# Patient Record
Sex: Male | Born: 1990 | Race: White | Hispanic: No | Marital: Single | Smoking: Former smoker
Health system: Southern US, Community
[De-identification: ages and names within clinical notes are randomized; demographics above are authoritative.]

---
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
## 2013-01-18 ENCOUNTER — Emergency Department: Payer: Self-pay | Admitting: Emergency Medicine

## 2013-01-18 LAB — URINALYSIS, COMPLETE
Bacteria: NONE SEEN
Bilirubin,UR: NEGATIVE
Blood: NEGATIVE
Glucose,UR: NEGATIVE mg/dL (ref 0–75)
Nitrite: NEGATIVE
Ph: 8 (ref 4.5–8.0)
Protein: 30
RBC,UR: 3 /HPF (ref 0–5)
Squamous Epithelial: NONE SEEN
WBC UR: <1 /HPF (ref 0–5)

## 2013-01-18 LAB — CBC
HGB: 15.7 g/dL (ref 13.0–18.0)
MCH: 30.3 pg (ref 26.0–34.0)
MCV: 88 fL (ref 80–100)
RBC: 5.17 10*6/uL (ref 4.40–5.90)
WBC: 8.2 10*3/uL (ref 3.8–10.6)

## 2013-01-18 LAB — COMPREHENSIVE METABOLIC PANEL
Alkaline Phosphatase: 83 U/L (ref 50–136)
Anion Gap: 6 — ABNORMAL LOW (ref 7–16)
BUN: 10 mg/dL (ref 7–18)
Bilirubin,Total: 0.7 mg/dL (ref 0.2–1.0)
Calcium, Total: 9.3 mg/dL (ref 8.5–10.1)
Chloride: 107 mmol/L (ref 98–107)
Creatinine: 0.92 mg/dL (ref 0.60–1.30)
EGFR (African American): >60
EGFR (Non-African Amer.): >60
Osmolality: 277 (ref 275–301)
Potassium: 3.3 mmol/L — ABNORMAL LOW (ref 3.5–5.1)
Sodium: 139 mmol/L (ref 136–145)

## 2013-01-18 LAB — LIPASE, BLOOD: Lipase: 166 U/L (ref 73–393)

## 2014-12-18 ENCOUNTER — Emergency Department
Admission: EM | Admit: 2014-12-18 | Discharge: 2014-12-18 | Disposition: A | Discharge status: Home / Self Care | Payer: Self-pay | Attending: Emergency Medicine | Admitting: Emergency Medicine

## 2014-12-18 ENCOUNTER — Encounter: Payer: Self-pay | Admitting: Emergency Medicine

## 2014-12-18 DIAGNOSIS — X58XXXA Exposure to other specified factors, initial encounter: Secondary | ICD-10-CM | POA: Insufficient documentation

## 2014-12-18 DIAGNOSIS — Z720 Tobacco use: Secondary | ICD-10-CM | POA: Insufficient documentation

## 2014-12-18 DIAGNOSIS — Y998 Other external cause status: Secondary | ICD-10-CM | POA: Insufficient documentation

## 2014-12-18 DIAGNOSIS — Y9389 Activity, other specified: Secondary | ICD-10-CM | POA: Insufficient documentation

## 2014-12-18 DIAGNOSIS — Y9289 Other specified places as the place of occurrence of the external cause: Secondary | ICD-10-CM | POA: Insufficient documentation

## 2014-12-18 DIAGNOSIS — S239XXA Sprain of unspecified parts of thorax, initial encounter: Secondary | ICD-10-CM | POA: Insufficient documentation

## 2014-12-18 MED ORDER — CYCLOBENZAPRINE HCL 10 MG PO TABS: 10.0000 mg | ORAL_TABLET | Freq: Three times a day (TID) | ORAL | Status: DC | PRN

## 2014-12-18 MED ORDER — TRAMADOL HCL 50 MG PO TABS
50.0000 mg | ORAL_TABLET | Freq: Once | ORAL | Status: AC
  Administered 2014-12-18: 50 mg via ORAL
  Filled 2014-12-18: qty 1

## 2014-12-18 MED ORDER — NAPROXEN 500 MG PO TABS
500.0000 mg | ORAL_TABLET | Freq: Once | ORAL | Status: AC
  Administered 2014-12-18: 500 mg via ORAL
  Filled 2014-12-18: qty 1

## 2014-12-18 MED ORDER — CYCLOBENZAPRINE HCL 10 MG PO TABS
10.0000 mg | ORAL_TABLET | Freq: Once | ORAL | Status: AC
  Administered 2014-12-18: 10 mg via ORAL
  Filled 2014-12-18: qty 1

## 2014-12-18 MED ORDER — NAPROXEN 500 MG PO TABS: 500.0000 mg | ORAL_TABLET | Freq: Two times a day (BID) | ORAL | Status: DC

## 2014-12-18 NOTE — Discharge Instructions (Signed)
Back Pain, Adult °Back pain is very common. The pain often gets better over time. The cause of back pain is usually not dangerous. Most people can learn to manage their back pain on their own.  °HOME CARE  °· Stay active. Start with short walks on flat ground if you can. Try to walk farther each day. °· Do not sit, drive, or stand in one place for more than 30 minutes. Do not stay in bed. °· Do not avoid exercise or work. Activity can help your back heal faster. °· Be careful when you bend or lift an object. Bend at your knees, keep the object close to you, and do not twist. °· Sleep on a firm mattress. Lie on your side, and bend your knees. If you lie on your back, put a pillow under your knees. °· Only take medicines as told by your doctor. °· Put ice on the injured area. °¨ Put ice in a plastic bag. °¨ Place a towel between your skin and the bag. °¨ Leave the ice on for 15-20 minutes, 03-04 times a day for the first 2 to 3 days. After that, you can switch between ice and heat packs. °· Ask your doctor about back exercises or massage. °· Avoid feeling anxious or stressed. Find good ways to deal with stress, such as exercise. °GET HELP RIGHT AWAY IF:  °· Your pain does not go away with rest or medicine. °· Your pain does not go away in 1 week. °· You have new problems. °· You do not feel well. °· The pain spreads into your legs. °· You cannot control when you poop (bowel movement) or pee (urinate). °· Your arms or legs feel weak or lose feeling (numbness). °· You feel sick to your stomach (nauseous) or throw up (vomit). °· You have belly (abdominal) pain. °· You feel like you may pass out (faint). °MAKE SURE YOU:  °· Understand these instructions. °· Will watch your condition. °· Will get help right away if you are not doing well or get worse. °Document Released: 09/29/2007 Document Revised: 07/05/2011 Document Reviewed: 08/14/2013 °ExitCare® Patient Information ©2015 [employer]. This information is not intended  to replace advice given to you by your health care provider. Make sure you discuss any questions you have with your health care provider. ° °

## 2014-12-18 NOTE — ED Notes (Signed)
Lower back pain after picking up something heavy

## 2014-12-18 NOTE — ED Provider Notes (Signed)
[facility] Emergency Department Provider Note  ____________________________________________  Time seen: Approximately 11:24 AM  I have reviewed the triage vital signs and the nursing notes.   HISTORY  Chief Complaint Back Pain    HPI Robert Dunlap is a 24 y.o. male patient complaining of mid and low back pain secondary to heavy. That yesterday. Patient states of some furniture about 10 PM last night and developed acute back pain. Patient denies any radicular component to this pain and patient is rating his pain as a 10 over 10 describe it as sharp. No palliative measures taken for this complaint. denies any bowel or bladder dysfunction.   History reviewed. No pertinent past medical history.  There are no active problems to display for this patient.   History reviewed. No pertinent past surgical history.  No current outpatient prescriptions on file.  Allergies Review of patient's allergies indicates no known allergies.  No family history on file.  Social History Social History  Substance Use Topics  . Smoking status: Current Every Day Smoker  . Smokeless tobacco: None  . Alcohol Use: No    Review of Systems Constitutional: No fever/chills Eyes: No visual changes. ENT: No sore throat. Cardiovascular: Denies chest pain. Respiratory: Denies shortness of breath. Gastrointestinal: No abdominal pain.  No nausea, no vomiting.  No diarrhea.  No constipation. Genitourinary: Negative for dysuria. Musculoskeletal: Positive for back pain Skin: Negative for rash. Neurological: Negative for headaches, focal weakness or numbness.  10-point ROS otherwise negative.  ____________________________________________   PHYSICAL EXAM:  VITAL SIGNS: ED Triage Vitals  Enc Vitals Group     BP 12/18/14 1107 107/68 mmHg     Pulse Rate 12/18/14 1107 76     Resp 12/18/14 1107 20     Temp 12/18/14 1107 98.2 F (36.8 C)     Temp Source 12/18/14 1107 Oral    SpO2 12/18/14 1107 99 %     Weight 12/18/14 1107 220 lb (99.791 kg)     Height 12/18/14 1107 6\' 1"  (1.854 m)     Head Cir --      Peak Flow --      Pain Score 12/18/14 1059 10     Pain Loc --      Pain Edu? --      Excl. in GC? --     Constitutional: Alert and oriented. Well appearing and in no acute distress. Eyes: Conjunctivae are normal. PERRL. EOMI. Head: Atraumatic. Nose: No congestion/rhinnorhea. Mouth/Throat: Mucous membranes are moist.  Oropharynx non-erythematous. Neck: No stridor.  No cervical spine tenderness to palpation. Hematological/Lymphatic/Immunilogical: No cervical lymphadenopathy. Cardiovascular: Normal rate, regular rhythm. Grossly normal heart sounds.  Good peripheral circulation. Respiratory: Normal respiratory effort.  No retractions. Lungs CTAB. Gastrointestinal: Soft and nontender. No distention. No abdominal bruits. No CVA tenderness. Musculoskeletal: No spinal deformity no ecchymosis or edema. Patient has some moderate guarding palpation of bilateral scapular muscles. She had full nuchal range of motion of the upper extremities. Patient had decreased range of motion with flexion. Patient straight leg test. Neurologic:  Normal speech and language. No gross focal neurologic deficits are appreciated. No gait instability. Skin:  Skin is warm, dry and intact. No rash noted. Psychiatric: Mood and affect are normal. Speech and behavior are normal.  ____________________________________________   LABS (all labs ordered are listed, but only abnormal results are displayed)  Labs Reviewed - No data to display ____________________________________________  EKG   ____________________________________________  RADIOLOGY   ____________________________________________   PROCEDURES  Procedure(s)  performed: None  Critical Care performed: No  ____________________________________________   INITIAL IMPRESSION / ASSESSMENT AND PLAN / ED COURSE  Pertinent  labs & imaging results that were available during my care of the patient were reviewed by me and considered in my medical decision making (see chart for details). Thoracic strain.. Patient advised on supportive home care. Patient given prescription for Flexeril and naproxen. Patient advised to follow-up with "clinic or return to ER if condition worsens. Patient given a work excuse for one day. ____________________________________________   FINAL CLINICAL IMPRESSION(S) / ED DIAGNOSES  Final diagnoses:  Thoracic back sprain, initial encounter      Joni Reining, PA-C 12/18/14 1133  Governor Rooks, MD 12/18/14 1210

## 2014-12-18 NOTE — ED Notes (Signed)
Lower back pain since last pm  Worse this am  Ambulates well to treatment room  And states pain is non radiating

## 2015-08-31 ENCOUNTER — Encounter: Payer: Self-pay | Admitting: Emergency Medicine

## 2015-08-31 ENCOUNTER — Emergency Department
Admission: EM | Admit: 2015-08-31 | Discharge: 2015-08-31 | Disposition: A | Discharge status: Home / Self Care | Payer: Self-pay | Attending: Emergency Medicine | Admitting: Emergency Medicine

## 2015-08-31 ENCOUNTER — Emergency Department: Payer: Self-pay

## 2015-08-31 DIAGNOSIS — F10129 Alcohol abuse with intoxication, unspecified: Secondary | ICD-10-CM | POA: Insufficient documentation

## 2015-08-31 DIAGNOSIS — F10920 Alcohol use, unspecified with intoxication, uncomplicated: Secondary | ICD-10-CM

## 2015-08-31 DIAGNOSIS — W19XXXA Unspecified fall, initial encounter: Secondary | ICD-10-CM | POA: Insufficient documentation

## 2015-08-31 DIAGNOSIS — F17200 Nicotine dependence, unspecified, uncomplicated: Secondary | ICD-10-CM | POA: Insufficient documentation

## 2015-08-31 NOTE — ED Notes (Signed)
EMS pt to Rm 23 from home with report of having a large amount of alcohol tonight. Pt was at home and would not stay in the bed or sitting down and his 7 month pregnant girlfriend was unable to keep him from falling and getting hurt so she called EMS.EMS reports pt blood sugar of 84 with bp of 110/70 and HR 80. Pt is alert and responsive but speech is slurred. Pt has abrasion to his forehead but is unsure if he has fallen or hit his head.

## 2015-08-31 NOTE — ED Notes (Signed)
Pt awake, given coke to drink, tolerating well, AO x4

## 2015-08-31 NOTE — ED Notes (Signed)
Pt resting in bed, eyes closed resp even and unlabored

## 2015-08-31 NOTE — ED Notes (Signed)
Resting in bed, eyes closed, resp even and unlabored

## 2015-08-31 NOTE — ED Notes (Signed)
Robert ChimesGrandmother Robert Dunlap [phone]

## 2015-08-31 NOTE — Discharge Instructions (Signed)
Alcohol Intoxication  Alcohol intoxication occurs when you drink enough alcohol that it affects your ability to function. It can be mild or very severe. Drinking a lot of alcohol in a short time is called binge drinking. This can be very harmful. Drinking alcohol can also be more dangerous if you are taking medicines or other drugs. Some of the effects caused by alcohol may include:  · Loss of coordination.  · Changes in mood and behavior.  · Unclear thinking.  · Trouble talking (slurred speech).  · Throwing up (vomiting).  · Confusion.  · Slowed breathing.  · Twitching and shaking (seizures).  · Loss of consciousness.  HOME CARE  · Do not drive after drinking alcohol.  · Drink enough water and fluids to keep your pee (urine) clear or pale yellow. Avoid caffeine.  · Only take medicine as told by your doctor.  GET HELP IF:  · You throw up (vomit) many times.  · You do not feel better after a few days.  · You frequently have alcohol intoxication. Your doctor can help decide if you should see a substance use treatment counselor.  GET HELP RIGHT AWAY IF:  · You become shaky when you stop drinking.  · You have twitching and shaking.  · You throw up blood. It may look bright red or like coffee grounds.  · You notice blood in your poop (bowel movements).  · You become lightheaded or pass out (faint).  MAKE SURE YOU:   · Understand these instructions.  · Will watch your condition.  · Will get help right away if you are not doing well or get worse.     This information is not intended to replace advice given to you by your health care provider. Make sure you discuss any questions you have with your health care provider.     Document Released: 09/29/2007 Document Revised: 12/13/2012 Document Reviewed: 09/15/2012  Elsevier Interactive Patient Education ©2016 [employer]

## 2015-08-31 NOTE — ED Provider Notes (Signed)
[facility] Emergency Department Provider Note   ____________________________________________  Time seen: Approximately 450 AM  I have reviewed the triage vital signs and the nursing notes.   HISTORY  Chief Complaint Alcohol Intoxication  The patient is intoxicated and is unable to fully participate in the history.  HPI Robert Dunlap is a 25 y.o. male who was brought into the hospital today with alcohol intoxication.According to EMS the patient had been drinking an unknown quantity and was with his fiance at home. The fianc is 7 months pregnant and the patient was falling while he was walking. He would not stay in the bed and she was afraid that he will hurt himself and she would be unable to care for him. She also was afraid that she may get hurt in the process of trying to protect the patient. The patient does not know exactly how much she drank and he denies any falls. He keeps asking for his keys, his wife and his wallet. The patient denies any pain at this time but does have some bruising to his body parts. He is here for evaluation with EMS.   History reviewed. No pertinent past medical history.  There are no active problems to display for this patient.   History reviewed. No pertinent past surgical history.  Current Outpatient Rx  Name  Route  Sig  Dispense  Refill  . cyclobenzaprine (FLEXERIL) 10 MG tablet   Oral   Take 1 tablet (10 mg total) by mouth every 8 (eight) hours as needed for muscle spasms.   15 tablet   0   . naproxen (NAPROSYN) 500 MG tablet   Oral   Take 1 tablet (500 mg total) by mouth 2 (two) times daily with a meal.   20 tablet   0     Allergies Review of patient's allergies indicates no known allergies.  History reviewed. No pertinent family history.  Social History Social History  Substance Use Topics  . Smoking status: Current Every Day Smoker  . Smokeless tobacco: None  . Alcohol Use: No    Review of  Systems Constitutional: No fever/chills Eyes: No visual changes. ENT: No sore throat. Cardiovascular: Denies chest pain. Respiratory: Denies shortness of breath. Gastrointestinal: No abdominal pain.  No nausea, no vomiting.   Genitourinary: Negative for dysuria. Musculoskeletal: Negative for back pain. Skin: Negative for rash. Neurological: Negative for headaches, Psychiatric:Alcohol intoxication  10-point ROS otherwise negative.  ____________________________________________   PHYSICAL EXAM:  VITAL SIGNS: ED Triage Vitals  Enc Vitals Group     BP 08/31/15 0503 116/68 mmHg     Pulse Rate 08/31/15 0503 66     Resp 08/31/15 0503 18     Temp 08/31/15 0503 97.6 F (36.4 C)     Temp Source 08/31/15 0503 Oral     SpO2 08/31/15 0503 100 %     Weight 08/31/15 0503 250 lb (113.399 kg)     Height 08/31/15 0503  (1.88 m)     Head Cir --      Peak Flow --      Pain Score --      Pain Loc --      Pain Edu? --      Excl. in GC? --     Constitutional: Alert and oriented. Well appearing and in mild to moderate distress. Eyes: Conjunctivae are normal. PERRL. EOMI. Head: Atraumatic. Nose: No congestion/rhinnorhea. Mouth/Throat: Mucous membranes are moist.  Oropharynx non-erythematous. Cardiovascular: Normal rate, regular  rhythm. Grossly normal heart sounds.  Good peripheral circulation. Respiratory: Normal respiratory effort.  No retractions. Lungs CTAB. Gastrointestinal: Soft and nontender. No distention. Positive bowel sounds Musculoskeletal: No lower extremity tenderness nor edema.  Neurologic:  Normal speech and language.  Skin:  Bruising to right forehead  Psychiatric: Mood and affect are normal.   ____________________________________________   LABS (all labs ordered are listed, but only abnormal results are displayed)  Labs Reviewed - No data to  display ____________________________________________  EKG  none ____________________________________________  RADIOLOGY  CT head and cervical spine: No acute intracranial abnormalities, normal alignment of the cervical spine, no acute displaced fractures identified. ____________________________________________   PROCEDURES  Procedure(s) performed: None  Critical Care performed: No  ____________________________________________   INITIAL IMPRESSION / ASSESSMENT AND PLAN / ED COURSE  Pertinent labs & imaging results that were available during my care of the patient were reviewed by me and considered in my medical decision making (see chart for details).  This is a 25 year old male who comes into the hospital today intoxicated. He was sent in by his fianc as she was unable to care for him. The patient is very intoxicated and slurring his speech. He does not walk with a steady gait at this time. We did a CT scan of his head and his cervical spine as we are unsure if he's fallen or where he hit but he does have some bruising to his forehead. We will allow the patient to sleep and sober up and then disposition him into the custody of his girlfriend when she arrives and is capable of caring for the patient. ____________________________________________   FINAL CLINICAL IMPRESSION(S) / ED DIAGNOSES  Final diagnoses:  Fall  Alcohol intoxication, uncomplicated (HCC)      NEW MEDICATIONS STARTED DURING THIS VISIT:  New Prescriptions   No medications on file     Note:  This document was prepared using Dragon voice recognition software and may include unintentional dictation errors.    Rebecka ApleyAllison P Donyell Carrell, MD 08/31/15 [phone]

## 2015-09-29 ENCOUNTER — Encounter: Payer: Self-pay | Admitting: Emergency Medicine

## 2015-09-29 ENCOUNTER — Emergency Department: Payer: Medicaid Other

## 2015-09-29 ENCOUNTER — Emergency Department
Admission: EM | Admit: 2015-09-29 | Discharge: 2015-09-29 | Disposition: A | Discharge status: Home / Self Care | Payer: Medicaid Other | Attending: Emergency Medicine | Admitting: Emergency Medicine

## 2015-09-29 DIAGNOSIS — F17200 Nicotine dependence, unspecified, uncomplicated: Secondary | ICD-10-CM | POA: Insufficient documentation

## 2015-09-29 DIAGNOSIS — M25461 Effusion, right knee: Secondary | ICD-10-CM

## 2015-09-29 MED ORDER — TRAMADOL HCL 50 MG PO TABS
50.0000 mg | ORAL_TABLET | Freq: Once | ORAL | Status: AC
  Administered 2015-09-29: 50 mg via ORAL
  Filled 2015-09-29: qty 1

## 2015-09-29 MED ORDER — TRAMADOL HCL 50 MG PO TABS: 50.0000 mg | ORAL_TABLET | Freq: Four times a day (QID) | ORAL | Status: AC | PRN

## 2015-09-29 MED ORDER — NAPROXEN 500 MG PO TABS
500.0000 mg | ORAL_TABLET | Freq: Once | ORAL | Status: AC
  Administered 2015-09-29: 500 mg via ORAL
  Filled 2015-09-29: qty 1

## 2015-09-29 MED ORDER — NAPROXEN 500 MG PO TABS: 500.0000 mg | ORAL_TABLET | Freq: Two times a day (BID) | ORAL | Status: DC

## 2015-09-29 NOTE — ED Notes (Signed)
Pt to triage via w/c with no distress noted; pt reports right knee pain last few days with no known specific injury; st hx of same

## 2015-09-29 NOTE — ED Provider Notes (Signed)
[facility] Emergency Department Provider Note    ____________________________________________  Time seen: Approximately 10:44 PM  I have reviewed the triage vital signs and the nursing notes.   HISTORY  Chief Complaint Knee Pain   HPI Robert Dunlap is a 25 y.o. male patient complained 2-3 days also. Patella pain and right lower extremity. Patient denies any provocative incident. Patient state this ongoing problem that wax and wane over the past year. Patient rates pain as a 10 over 10. No palliative measures taken for this complaint. Patient states pain increase with prolonged standing and ambulation.   History reviewed. No pertinent past medical history.  There are no active problems to display for this patient.   History reviewed. No pertinent past surgical history.  Current Outpatient Rx  Name  Route  Sig  Dispense  Refill  . cyclobenzaprine (FLEXERIL) 10 MG tablet   Oral   Take 1 tablet (10 mg total) by mouth every 8 (eight) hours as needed for muscle spasms.   15 tablet   0   . naproxen (NAPROSYN) 500 MG tablet   Oral   Take 1 tablet (500 mg total) by mouth 2 (two) times daily with a meal.   20 tablet   0   . naproxen (NAPROSYN) 500 MG tablet   Oral   Take 1 tablet (500 mg total) by mouth 2 (two) times daily with a meal.   20 tablet   00   . traMADol (ULTRAM) 50 MG tablet   Oral   Take 1 tablet (50 mg total) by mouth every 6 (six) hours as needed.   20 tablet   0     Allergies Review of patient's allergies indicates no known allergies.  No family history on file.  Social History Social History  Substance Use Topics  . Smoking status: Current Every Day Smoker  . Smokeless tobacco: None  . Alcohol Use: No    Review of Systems Constitutional: No fever/chills Eyes: No visual changes. ENT: No sore throat. Cardiovascular: Denies chest pain. Respiratory: Denies shortness of breath. Gastrointestinal: No abdominal  pain.  No nausea, no vomiting.  No diarrhea.  No constipation. Genitourinary: Negative for dysuria. Musculoskeletal: Right knee pain  Skin: Negative for rash. Neurological: Negative for headaches, focal weakness or numbness.    ____________________________________________   PHYSICAL EXAM:  VITAL SIGNS: ED Triage Vitals  Enc Vitals Group     BP 09/29/15 2234 123/68 mmHg     Pulse Rate 09/29/15 2234 91     Resp 09/29/15 2234 20     Temp 09/29/15 2234 98 F (36.7 C)     Temp Source 09/29/15 2234 Oral     SpO2 09/29/15 2234 100 %     Weight 09/29/15 2234 220 lb (99.791 kg)     Height 09/29/15 2234  (1.854 m)     Head Cir --      Peak Flow --      Pain Score 09/29/15 2233 10     Pain Loc --      Pain Edu? --      Excl. in GC? --     Constitutional: Alert and oriented. Well appearing and in no acute distress. Eyes: Conjunctivae are normal. PERRL. EOMI. Head: Atraumatic. Nose: No congestion/rhinnorhea. Mouth/Throat: Mucous membranes are moist.  Oropharynx non-erythematous. Neck: No stridor.  No cervical spine tenderness to palpation. Hematological/Lymphatic/Immunilogical: No cervical lymphadenopathy. Cardiovascular: Normal rate, regular rhythm. Grossly normal heart sounds.  Good peripheral circulation.  Respiratory: Normal respiratory effort.  No retractions. Lungs CTAB. Gastrointestinal: Soft and nontender. No distention. No abdominal bruits. No CVA tenderness. Musculoskeletal: Obvious deformity of the right knee. Full and equal range of motion. No laxity with stress testing. Patient has some moderate crepitus with palpation. Patella.  Neurologic:  Normal speech and language. No gross focal neurologic deficits are appreciated. No gait instability. Skin:  Skin is warm, dry and intact. No rash noted. Psychiatric: Mood and affect are normal. Speech and behavior are normal.  ____________________________________________   LABS (all labs ordered are listed, but only  abnormal results are displayed)  Labs Reviewed - No data to display ____________________________________________  EKG   ____________________________________________  RADIOLOGY  X-ray reveals some mild effusion but no bony abnormalities. ____________________________________________   PROCEDURES  Procedure(s) performed:   Critical Care performed:   ____________________________________________   INITIAL IMPRESSION / ASSESSMENT AND PLAN / ED COURSE  Pertinent labs & imaging results that were available during my care of the patient were reviewed by me and considered in my medical decision making (see chart for details).  Right knee effusion. This x-ray finding with patient. Patient given discharge Instructions. Patient [facility] for [facility] evaluation and treatment. Patient given a prescription for tramadol and naproxen. Patient given a work note. ____________________________________________   FINAL CLINICAL IMPRESSION(S) / ED DIAGNOSES  Final diagnoses:  Knee effusion, right      NEW MEDICATIONS STARTED DURING THIS VISIT:  New Prescriptions   NAPROXEN (NAPROSYN) 500 MG TABLET    Take 1 tablet (500 mg total) by mouth 2 (two) times daily with a meal.   TRAMADOL (ULTRAM) 50 MG TABLET    Take 1 tablet (50 mg total) by mouth every 6 (six) hours as needed.     Note:  This document was prepared using Dragon voice recognition software and may include unintentional dictation errors.    Joni Reiningonald K Smith, PA-C 09/29/15 [id number]  Phineas SemenGraydon Goodman, MD 09/29/15 [phone]

## 2015-09-29 NOTE — Discharge Instructions (Signed)
Knee Effusion °Knee effusion means that you have extra fluid in your knee. This can cause pain. Your knee may be more difficult to bend and move. °HOME CARE °· Use crutches as told by your doctor. °· Wear a knee brace as told by your doctor. °· Apply ice to the swollen area: °¨ Put ice in a plastic bag. °¨ Place a towel between your skin and the bag. °¨ Leave the ice on for 20 minutes, 2-3 times per day. °· Keep your knee raised (elevated) when you are sitting or lying down. °· Take medicines only as told by your doctor. °· Do any rehabilitation or strengthening exercises as told by your doctor. °· Rest your knee as told by your doctor. You may start doing your normal activities again when your doctor says it is okay. °· Keep all follow-up visits as told by your doctor. This is important. °GET HELP IF:  °· You continue to have pain in your knee. °GET HELP RIGHT AWAY IF: °· You have increased swelling or redness of your knee. °· You have severe pain in your knee. °· You have a fever. °  °This information is not intended to replace advice given to you by your health care provider. Make sure you discuss any questions you have with your health care provider. °  °Document Released: 05/15/2010 Document Revised: 05/03/2014 Document Reviewed: 11/26/2013 °Elsevier Interactive Patient Education ©2016 [employer]. ° °

## 2015-09-29 NOTE — ED Notes (Signed)

## 2016-03-16 ENCOUNTER — Emergency Department
Admission: EM | Admit: 2016-03-16 | Discharge: 2016-03-16 | Disposition: A | Discharge status: Home / Self Care | Payer: Self-pay | Attending: Emergency Medicine | Admitting: Emergency Medicine

## 2016-03-16 DIAGNOSIS — Z79899 Other long term (current) drug therapy: Secondary | ICD-10-CM | POA: Insufficient documentation

## 2016-03-16 DIAGNOSIS — B9789 Other viral agents as the cause of diseases classified elsewhere: Secondary | ICD-10-CM

## 2016-03-16 DIAGNOSIS — F17200 Nicotine dependence, unspecified, uncomplicated: Secondary | ICD-10-CM | POA: Insufficient documentation

## 2016-03-16 DIAGNOSIS — J069 Acute upper respiratory infection, unspecified: Secondary | ICD-10-CM | POA: Insufficient documentation

## 2016-03-16 MED ORDER — PSEUDOEPH-BROMPHEN-DM 30-2-10 MG/5ML PO SYRP: 10.0000 mL | ORAL_SOLUTION | Freq: Four times a day (QID) | ORAL | 0 refills | Status: DC | PRN

## 2016-03-16 MED ORDER — FLUTICASONE PROPIONATE 50 MCG/ACT NA SUSP: 2.0000 | Freq: Every day | NASAL | 0 refills | Status: DC

## 2016-03-16 NOTE — ED Triage Notes (Signed)
Pt in with co cough and congestion x few days, family has had same symptoms.

## 2016-03-16 NOTE — ED Provider Notes (Signed)
[facility] Emergency Department Provider Note  ____________________________________________  Time seen: Approximately 8:03 PM  I have reviewed the triage vital signs and the nursing notes.   HISTORY  Chief Complaint Cough    HPI Robert Dunlap is a 25 y.o. male , NAD, presents to the emergency department with 3 day history of nasal congestion, runny nose, cough and chest congestion. States that his family has been sick with viral upper respiratory infections over the last week. Feels the symptoms are the same. Has not had any fevers, chills, body aches. Denies any chest pain, shortness breath, abdominal pain, nausea or vomiting. States he did not feel well enough to go to work earlier today and will need a work note. Has not been taking anything over-the-counter for his symptoms.   No past medical history on file.  There are no active problems to display for this patient.   No past surgical history on file.  Prior to Admission medications   Medication Sig Start Date End Date Taking? Authorizing Provider  brompheniramine-pseudoephedrine-DM 30-2-10 MG/5ML syrup Take 10 mLs by mouth 4 (four) times daily as needed. 03/16/16   Jami L Hagler, PA-C  cyclobenzaprine (FLEXERIL) 10 MG tablet Take 1 tablet (10 mg total) by mouth every 8 (eight) hours as needed for muscle spasms. 12/18/14   Joni Reiningonald K Smith, PA-C  fluticasone (FLONASE) 50 MCG/ACT nasal spray Place 2 sprays into both nostrils daily. 03/16/16   Jami L Hagler, PA-C  naproxen (NAPROSYN) 500 MG tablet Take 1 tablet (500 mg total) by mouth 2 (two) times daily with a meal. 12/18/14   Joni Reiningonald K Smith, PA-C  naproxen (NAPROSYN) 500 MG tablet Take 1 tablet (500 mg total) by mouth 2 (two) times daily with a meal. 09/29/15   Joni Reiningonald K Smith, PA-C  traMADol (ULTRAM) 50 MG tablet Take 1 tablet (50 mg total) by mouth every 6 (six) hours as needed. 09/29/15 09/28/16  Joni Reiningonald K Smith, PA-C    Allergies Patient has no known  allergies.  No family history on file.  Social History Social History  Substance Use Topics  . Smoking status: Current Every Day Smoker  . Smokeless tobacco: Not on file  . Alcohol use No     Review of Systems  Constitutional: Positive fatigue. No fever/chills ENT: Positive nasal congestion, runny nose. No sore throat, ear pain, sinus pressure. Cardiovascular: No chest pain. Respiratory: Positive cough, chest congestion. No shortness of breath. No wheezing.  Gastrointestinal: No abdominal pain.  No nausea, vomiting. Musculoskeletal: Negative for general myalgias.  Skin: Negative for rash. Neurological: Negative for headaches. 10-point ROS otherwise negative.  ____________________________________________   PHYSICAL EXAM:  VITAL SIGNS: ED Triage Vitals  Enc Vitals Group     BP 03/16/16 1942 140/60     Pulse Rate 03/16/16 1942 92     Resp 03/16/16 1942 18     Temp 03/16/16 1942 98.4 F (36.9 C)     Temp Source 03/16/16 1942 Oral     SpO2 03/16/16 1942 100 %     Weight 03/16/16 1942 220 lb (99.8 kg)     Height 03/16/16 1942 6\' 1"  (1.854 m)     Head Circumference --      Peak Flow --      Pain Score 03/16/16 1957 0     Pain Loc --      Pain Edu? --      Excl. in GC? --      Constitutional: Alert and oriented. Well  appearing and in no acute distress. Eyes: Conjunctivae are normal.  Head: Atraumatic. ENT:      Ears: TMs visualized without erythema, effusion, bulging, perforation.      Nose: Nose congestion with trace clear rhinorrhea. Bilateral turbinates are injected.      Mouth/Throat: Mucous membranes are moist. Eyes without erythema, swelling, exudate. Clear postnasal drip. Uvula is midline. Airway is patent. Neck: No stridor. Supple with full range of motion. Hematological/Lymphatic/Immunilogical: No cervical lymphadenopathy. Cardiovascular: Normal rate, regular rhythm. Normal S1 and S2.  Good peripheral circulation. Respiratory: Normal respiratory effort  without tachypnea or retractions. Lungs CTAB with breath sounds noted in all lung fields. No wheeze, rhonchi, rales. Neurologic:  Normal speech and language. No gross focal neurologic deficits are appreciated.  Skin:  Skin is warm, dry and intact. No rash noted. Psychiatric: Mood and affect are normal. Speech and behavior are normal. Patient exhibits appropriate insight and judgement.   ____________________________________________   LABS  None ____________________________________________  EKG  None ____________________________________________  RADIOLOGY  None ____________________________________________    PROCEDURES  Procedure(s) performed: None   Procedures   Medications - No data to display   ____________________________________________   INITIAL IMPRESSION / ASSESSMENT AND PLAN / ED COURSE  Pertinent labs & imaging results that were available during my care of the patient were reviewed by me and considered in my medical decision making (see chart for details).  Clinical Course     Patient's diagnosis is consistent with Viral URI with cough. Patient will be discharged home with prescriptions for Bromfed-DM and Flonase to take as directed. Patient was given a work note to excuse him from work today and may return tomorrow without restrictions. [facility] if symptoms persist past this treatment course. Patient is given ED precautions to return to the ED for any worsening or new symptoms.    ____________________________________________  FINAL CLINICAL IMPRESSION(S) / ED DIAGNOSES  Final diagnoses:  Viral URI with cough      NEW MEDICATIONS STARTED DURING THIS VISIT:  New Prescriptions   BROMPHENIRAMINE-PSEUDOEPHEDRINE-DM 30-2-10 MG/5ML SYRUP    Take 10 mLs by mouth 4 (four) times daily as needed.   FLUTICASONE (FLONASE) 50 MCG/ACT NASAL SPRAY    Place 2 sprays into both nostrils daily.          Hope PigeonJami L  Hagler, PA-C 03/16/16 2007    Myrna Blazeravid Matthew Schaevitz, MD 03/16/16 [phone]

## 2016-06-22 ENCOUNTER — Emergency Department: Payer: Medicaid Other

## 2016-06-22 ENCOUNTER — Encounter: Payer: Self-pay | Admitting: Emergency Medicine

## 2016-06-22 DIAGNOSIS — S8992XA Unspecified injury of left lower leg, initial encounter: Secondary | ICD-10-CM | POA: Insufficient documentation

## 2016-06-22 DIAGNOSIS — Z5321 Procedure and treatment not carried out due to patient leaving prior to being seen by health care provider: Secondary | ICD-10-CM | POA: Insufficient documentation

## 2016-06-22 DIAGNOSIS — Y929 Unspecified place or not applicable: Secondary | ICD-10-CM | POA: Insufficient documentation

## 2016-06-22 DIAGNOSIS — F17200 Nicotine dependence, unspecified, uncomplicated: Secondary | ICD-10-CM | POA: Insufficient documentation

## 2016-06-22 DIAGNOSIS — W14XXXA Fall from tree, initial encounter: Secondary | ICD-10-CM | POA: Insufficient documentation

## 2016-06-22 DIAGNOSIS — Y999 Unspecified external cause status: Secondary | ICD-10-CM | POA: Insufficient documentation

## 2016-06-22 DIAGNOSIS — Y939 Activity, unspecified: Secondary | ICD-10-CM | POA: Insufficient documentation

## 2016-06-22 NOTE — ED Triage Notes (Signed)
Pt to triage via w/c with no distress noted; pt reports fell out of tree, landing on feet & injuring left knee; st heard "pop"; pt denies any other c/o or injuries

## 2016-06-23 ENCOUNTER — Emergency Department
Admission: EM | Admit: 2016-06-23 | Discharge: 2016-06-23 | Disposition: A | Discharge status: Home / Self Care | Payer: Medicaid Other | Attending: Emergency Medicine | Admitting: Emergency Medicine

## 2016-12-16 ENCOUNTER — Emergency Department: Payer: Self-pay

## 2016-12-16 ENCOUNTER — Observation Stay
Admission: EM | Admit: 2016-12-16 | Discharge: 2016-12-17 | Disposition: A | Discharge status: Home / Self Care | Payer: Self-pay | Attending: Internal Medicine | Admitting: Internal Medicine

## 2016-12-16 DIAGNOSIS — E876 Hypokalemia: Secondary | ICD-10-CM | POA: Insufficient documentation

## 2016-12-16 DIAGNOSIS — F17200 Nicotine dependence, unspecified, uncomplicated: Secondary | ICD-10-CM | POA: Insufficient documentation

## 2016-12-16 DIAGNOSIS — K529 Noninfective gastroenteritis and colitis, unspecified: Principal | ICD-10-CM | POA: Insufficient documentation

## 2016-12-16 DIAGNOSIS — R161 Splenomegaly, not elsewhere classified: Secondary | ICD-10-CM | POA: Insufficient documentation

## 2016-12-16 DIAGNOSIS — E860 Dehydration: Secondary | ICD-10-CM | POA: Insufficient documentation

## 2016-12-16 LAB — DIFFERENTIAL
BASOS ABS: 0 10*3/uL (ref 0–0.1)
BASOS PCT: 0 %
Eosinophils Absolute: 0 10*3/uL (ref 0–0.7)
Eosinophils Relative: 1 %
LYMPHS ABS: 1 10*3/uL (ref 1.0–3.6)
Lymphocytes Relative: 21 %
Monocytes Absolute: 0.8 10*3/uL (ref 0.2–1.0)
Monocytes Relative: 17 %
Neutro Abs: 2.9 10*3/uL (ref 1.4–6.5)
Neutrophils Relative %: 61 %

## 2016-12-16 LAB — URINALYSIS, COMPLETE (UACMP) WITH MICROSCOPIC
Bacteria, UA: NONE SEEN
Bilirubin Urine: NEGATIVE
Glucose, UA: NEGATIVE mg/dL
KETONES UR: NEGATIVE mg/dL
Leukocytes, UA: NEGATIVE
Nitrite: NEGATIVE
PH: 6 (ref 5.0–8.0)
PROTEIN: NEGATIVE mg/dL
SPECIFIC GRAVITY, URINE: 1.027 (ref 1.005–1.030)

## 2016-12-16 LAB — CBC
HCT: 44.7 % (ref 40.0–52.0)
Hemoglobin: 15.6 g/dL (ref 13.0–18.0)
MCH: 30.3 pg (ref 26.0–34.0)
MCHC: 35 g/dL (ref 32.0–36.0)
MCV: 86.5 fL (ref 80.0–100.0)
PLATELETS: 190 10*3/uL (ref 150–440)
RBC: 5.17 MIL/uL (ref 4.40–5.90)
RDW: 13.4 % (ref 11.5–14.5)
WBC: 4.7 10*3/uL (ref 3.8–10.6)

## 2016-12-16 LAB — COMPREHENSIVE METABOLIC PANEL
ALBUMIN: 4.3 g/dL (ref 3.5–5.0)
ALK PHOS: 52 U/L (ref 38–126)
ALT: 19 U/L (ref 17–63)
AST: 21 U/L (ref 15–41)
Anion gap: 7 (ref 5–15)
BUN: 8 mg/dL (ref 6–20)
CALCIUM: 9 mg/dL (ref 8.9–10.3)
CO2: 30 mmol/L (ref 22–32)
CREATININE: 0.89 mg/dL (ref 0.61–1.24)
Chloride: 103 mmol/L (ref 101–111)
GFR calc Af Amer: >60 mL/min (ref >60)
GFR calc non Af Amer: >60 mL/min (ref >60)
GLUCOSE: 111 mg/dL — AB (ref 65–99)
Potassium: 3.3 mmol/L — ABNORMAL LOW (ref 3.5–5.1)
SODIUM: 140 mmol/L (ref 135–145)
Total Bilirubin: 0.4 mg/dL (ref 0.3–1.2)
Total Protein: 7.4 g/dL (ref 6.5–8.1)

## 2016-12-16 LAB — C DIFFICILE QUICK SCREEN W PCR REFLEX
C DIFFICILE (CDIFF) INTERP: NOT DETECTED
C DIFFICILE (CDIFF) TOXIN: NEGATIVE
C DIFFICLE (CDIFF) ANTIGEN: NEGATIVE

## 2016-12-16 LAB — LIPASE, BLOOD: Lipase: 25 U/L (ref 11–51)

## 2016-12-16 MED ORDER — ENOXAPARIN SODIUM 40 MG/0.4ML ~~LOC~~ SOLN: 40.0000 mg | SUBCUTANEOUS | Status: DC

## 2016-12-16 MED ORDER — MORPHINE SULFATE (PF) 2 MG/ML IV SOLN
2.0000 mg | INTRAVENOUS | Status: DC | PRN
  Administered 2016-12-16 – 2016-12-17 (×3): 2 mg via INTRAVENOUS
  Filled 2016-12-16 (×3): qty 1

## 2016-12-16 MED ORDER — BISACODYL 5 MG PO TBEC
5.0000 mg | DELAYED_RELEASE_TABLET | Freq: Every day | ORAL | Status: DC | PRN
  Filled 2016-12-16: qty 1

## 2016-12-16 MED ORDER — CIPROFLOXACIN IN D5W 400 MG/200ML IV SOLN
400.0000 mg | Freq: Two times a day (BID) | INTRAVENOUS | Status: DC
  Administered 2016-12-17: 400 mg via INTRAVENOUS
  Filled 2016-12-16 (×2): qty 200

## 2016-12-16 MED ORDER — ONDANSETRON HCL 4 MG PO TABS
4.0000 mg | ORAL_TABLET | Freq: Four times a day (QID) | ORAL | Status: DC | PRN
Start: 2016-12-16 — End: 2016-12-17

## 2016-12-16 MED ORDER — SODIUM CHLORIDE 0.9 % IV BOLUS (SEPSIS)
1000.0000 mL | Freq: Once | INTRAVENOUS | Status: AC
  Administered 2016-12-16: 1000 mL via INTRAVENOUS

## 2016-12-16 MED ORDER — METRONIDAZOLE IN NACL 5-0.79 MG/ML-% IV SOLN
500.0000 mg | Freq: Once | INTRAVENOUS | Status: DC
  Filled 2016-12-16: qty 100

## 2016-12-16 MED ORDER — METRONIDAZOLE IN NACL 5-0.79 MG/ML-% IV SOLN
500.0000 mg | Freq: Three times a day (TID) | INTRAVENOUS | Status: DC
  Administered 2016-12-16 – 2016-12-17 (×3): 500 mg via INTRAVENOUS
  Filled 2016-12-16 (×4): qty 100

## 2016-12-16 MED ORDER — POTASSIUM CHLORIDE 10 MEQ/100ML IV SOLN
10.0000 meq | INTRAVENOUS | Status: AC
  Administered 2016-12-16 (×2): 10 meq via INTRAVENOUS
  Filled 2016-12-16 (×3): qty 100

## 2016-12-16 MED ORDER — IOPAMIDOL (ISOVUE-300) INJECTION 61%
100.0000 mL | Freq: Once | INTRAVENOUS | Status: AC | PRN
  Administered 2016-12-16: 100 mL via INTRAVENOUS

## 2016-12-16 MED ORDER — CIPROFLOXACIN IN D5W 400 MG/200ML IV SOLN
400.0000 mg | Freq: Once | INTRAVENOUS | Status: AC
  Administered 2016-12-16: 400 mg via INTRAVENOUS
  Filled 2016-12-16: qty 200

## 2016-12-16 MED ORDER — NICOTINE 21 MG/24HR TD PT24: 21.0000 mg | MEDICATED_PATCH | Freq: Every day | TRANSDERMAL | Status: DC

## 2016-12-16 MED ORDER — IOPAMIDOL (ISOVUE-300) INJECTION 61%
30.0000 mL | Freq: Once | INTRAVENOUS | Status: AC | PRN
  Administered 2016-12-16: 30 mL via ORAL

## 2016-12-16 MED ORDER — ONDANSETRON HCL 4 MG/2ML IJ SOLN
4.0000 mg | Freq: Once | INTRAMUSCULAR | Status: AC
  Administered 2016-12-16: 4 mg via INTRAVENOUS
  Filled 2016-12-16: qty 2

## 2016-12-16 MED ORDER — FLUTICASONE PROPIONATE 50 MCG/ACT NA SUSP
2.0000 | Freq: Every day | NASAL | Status: DC
  Filled 2016-12-16: qty 16

## 2016-12-16 MED ORDER — DOCUSATE SODIUM 100 MG PO CAPS: 100.0000 mg | ORAL_CAPSULE | Freq: Two times a day (BID) | ORAL | Status: DC

## 2016-12-16 MED ORDER — SODIUM CHLORIDE 0.9 % IV SOLN
INTRAVENOUS | Status: DC
  Administered 2016-12-16 – 2016-12-17 (×2): via INTRAVENOUS

## 2016-12-16 MED ORDER — MORPHINE SULFATE (PF) 4 MG/ML IV SOLN
4.0000 mg | Freq: Once | INTRAVENOUS | Status: AC
  Administered 2016-12-16: 4 mg via INTRAVENOUS
  Filled 2016-12-16: qty 1

## 2016-12-16 MED ORDER — ONDANSETRON HCL 4 MG/2ML IJ SOLN: 4.0000 mg | Freq: Four times a day (QID) | INTRAMUSCULAR | Status: DC | PRN

## 2016-12-16 NOTE — Progress Notes (Signed)
ANTIBIOTIC CONSULT NOTE - INITIAL  Pharmacy Consult for Cipro Indication: intra-abdominal infection  Allergies  Allergen Reactions  . Hydrocodone-Acetaminophen Nausea And Vomiting    Patient Measurements: Height: 6\' 1"  (185.4 cm) Weight: 195 lb (88.5 kg) IBW/kg (Calculated) : 79.9 Adjusted Body Weight:   Vital Signs: Temp: 99.3 F (37.4 C) (08/23 1203) Temp Source: Oral (08/23 1203) BP: 120/69 (08/23 1330) Pulse Rate: 52 (08/23 1330) Intake/Output from previous day: No intake/output data recorded. Intake/Output from this shift: Total I/O In: 1000 [IV Piggyback:1000] Out: -   Labs:  Recent Labs  12/16/16 1200  WBC 4.7  HGB 15.6  PLT 190  CREATININE 0.89   Estimated Creatinine Clearance: 144.6 mL/min (by C-G formula based on SCr of 0.89 mg/dL). No results for input(s): VANCOTROUGH, VANCOPEAK, VANCORANDOM, GENTTROUGH, GENTPEAK, GENTRANDOM, TOBRATROUGH, TOBRAPEAK, TOBRARND, AMIKACINPEAK, AMIKACINTROU, AMIKACIN in the last 72 hours.   Microbiology: No results found for this or any previous visit (from the past 720 hour(s)).  Medical History: History reviewed. No pertinent past medical history.  Medications:   (Not in a hospital admission) Scheduled:  . docusate sodium  100 mg Oral BID  . enoxaparin (LOVENOX) injection  40 mg Subcutaneous Q24H  . fluticasone  2 spray Each Nare Daily   Infusions:  . sodium chloride    . ciprofloxacin    . [START ON 12/17/2016] ciprofloxacin    . metronidazole    . metronidazole    . potassium chloride    . sodium chloride     Assessment: Pharmacy consulted to dose and monitor Cipro in this 26 yr old male being treated for intra-abdominal infection  Goal of Therapy:    Plan:  Will start Cipro 400 mg IV q12 hours.    Betsi Crespi D 12/16/2016,3:21 PM

## 2016-12-16 NOTE — ED Notes (Signed)
2nd IV attempted X2 by Noreene Larsson RN without success

## 2016-12-16 NOTE — ED Notes (Signed)
Pt reports that he has not ate/drank in 2 days and he has had vomiting and diarrhea since Monday ( vomited x4 in 24 hours - diarrhea 20+ in 24 hours) - c/o left lower abd pain that is worse with movement - c/o excessive passing of gas

## 2016-12-16 NOTE — ED Notes (Signed)
CT notified that pt finished drinking contrast. 

## 2016-12-16 NOTE — ED Provider Notes (Signed)
[facility] Emergency Department Provider Note   ____________________________________________   First MD Initiated Contact with Patient 12/16/16 1251     (approximate)  I have reviewed the triage vital signs and the nursing notes.   HISTORY  Chief Complaint Abdominal Pain    HPI LATERRIAN MCKERNAN is a 26 y.o. male Who complains of 2-3 days of left lower quadrant pain crampy in nature severe most of the time especially now with fever intermittently and nausea vomiting and diarrhea. He reports his been a small amount of blood in the diarrhea.he reports a file still he feels a little bit better erythema gets up and move around everything gets worse.   History reviewed. No pertinent past medical history.  There are no active problems to display for this patient.   History reviewed. No pertinent surgical history.  Prior to Admission medications   Medication Sig Start Date End Date Taking? Authorizing Provider  brompheniramine-pseudoephedrine-DM 30-2-10 MG/5ML syrup Take 10 mLs by mouth 4 (four) times daily as needed. Patient not taking: Reported on 12/16/2016 03/16/16   Hagler, Jami L, PA-C  cyclobenzaprine (FLEXERIL) 10 MG tablet Take 1 tablet (10 mg total) by mouth every 8 (eight) hours as needed for muscle spasms. Patient not taking: Reported on 12/16/2016 12/18/14   Joni Reining, PA-C  fluticasone [facility]) 50 MCG/ACT nasal spray Place 2 sprays into both nostrils daily. Patient not taking: Reported on 12/16/2016 03/16/16   Hagler, Jami L, PA-C  naproxen (NAPROSYN) 500 MG tablet Take 1 tablet (500 mg total) by mouth 2 (two) times daily with a meal. Patient not taking: Reported on 12/16/2016 12/18/14   Joni Reining, PA-C  naproxen (NAPROSYN) 500 MG tablet Take 1 tablet (500 mg total) by mouth 2 (two) times daily with a meal. Patient not taking: Reported on 12/16/2016 09/29/15   Joni Reining, PA-C    Allergies Hydrocodone-acetaminophen  No family  history on file.  Social History Social History  Substance Use Topics  . Smoking status: Current Every Day Smoker  . Smokeless tobacco: Never Used  . Alcohol use Yes    Review of Systems  Constitutional:  fever/chills Eyes: No visual changes. ENT: No sore throat. Cardiovascular: Denies chest pain. Respiratory: Denies shortness of breath. Gastrointestinal: see history of present illness Genitourinary: Negative for dysuria. Musculoskeletal: Negative for back pain. Skin: Negative for rash. Neurological: Negative for headaches, focal weakness   ____________________________________________   PHYSICAL EXAM:  VITAL SIGNS: ED Triage Vitals  Enc Vitals Group     BP 12/16/16 1204 (!) 123/58     Pulse Rate 12/16/16 1204 74     Resp --      Temp 12/16/16 1203 99.3 F (37.4 C)     Temp Source 12/16/16 1203 Oral     SpO2 12/16/16 1204 100 %     Weight 12/16/16 1203 195 lb (88.5 kg)     Height 12/16/16 1203 6\' 1"  (1.854 m)     Head Circumference --      Peak Flow --      Pain Score 12/16/16 1202 10     Pain Loc --      Pain Edu? --      Excl. in GC? --     Constitutional: Alert and oriented. Well appearing and in no acute distress. Eyes: Conjunctivae are normal.  Head: Atraumatic. Nose: No congestion/rhinnorhea. Mouth/Throat: Mucous membranes are moist.  Oropharynx non-erythematous. Neck: No stridor.  Cardiovascular: Normal rate, regular rhythm. Grossly normal  heart sounds.  Good peripheral circulation. Respiratory: Normal respiratory effort.  No retractions. Lungs CTAB. Gastrointestinal: Soft tender to palpation percussion in the left lower quadrant. No distention. No abdominal bruits. No CVA tenderness. Musculoskeletal: No lower extremity tenderness nor edema.  No joint effusions. Neurologic:  Normal speech and language. No gross focal neurologic deficits are appreciated. No gait instability. Skin:  Skin is warm, dry and intact. No rash noted. Psychiatric: Mood and  affect are normal. Speech and behavior are normal.  ____________________________________________   LABS (all labs ordered are listed, but only abnormal results are displayed)  Labs Reviewed  COMPREHENSIVE METABOLIC PANEL - Abnormal; Notable for the following:       Result Value   Potassium 3.3 (*)    Glucose, Bld 111 (*)    All other components within normal limits  URINALYSIS, COMPLETE (UACMP) WITH MICROSCOPIC - Abnormal; Notable for the following:    Color, Urine AMBER (*)    APPearance CLEAR (*)    Hgb urine dipstick SMALL (*)    Squamous Epithelial / LPF 0-5 (*)    All other components within normal limits  C DIFFICILE QUICK SCREEN W PCR REFLEX  LIPASE, BLOOD  CBC  DIFFERENTIAL   ____________________________________________  EKG   ____________________________________________  RADIOLOGY IMPRESSION: 1. Distal descending colon and proximal sigmoid colon colitis with mesenteric thickening most notable in the proximal sigmoid colon. No irregular diverticular noted to indicate diverticulitis as opposed to nonspecific colitis. No perforation or abscess evident. There are is no similar bowel wall or mesenteric thickening elsewhere. No bowel obstruction.  2. No abscess elsewhere in the abdomen or pelvis. Appendix appears normal.  3. Splenomegaly of uncertain etiology. No focal splenic lesions are evident.  4.  No renal or ureteral calculus.  No hydronephrosis.   Electronically Signed   By: Bretta Bang III M.D.   On: 12/16/2016 14:13   ____________________________________________   PROCEDURES  Procedure(s) performed:  Procedures  Critical Care performed:  ____________________________________________   INITIAL IMPRESSION / ASSESSMENT AND PLAN / ED COURSE  Pertinent labs & imaging results that were available during my care of the patient were reviewed by me and considered in my medical decision making (see chart for details).     patient  continues to have a lot of left lower quadrant pain. He has been unable to stool.Will try to admit him to the hospitalist his belly is very tender in the left lower quadrant to palpation and percussion consistent withnitis   ____________________________________________   FINAL CLINICAL IMPRESSION(S) / ED DIAGNOSES  Final diagnoses:  Colitis      NEW MEDICATIONS STARTED DURING THIS VISIT:  New Prescriptions   No medications on file     Note:  This document was prepared using Dragon voice recognition software and may include unintentional dictation errors.    Arnaldo Natal, MD 12/16/16 [phone]

## 2016-12-16 NOTE — ED Triage Notes (Signed)
Pt c/o LLQ pain with N/V/D for the past 2-3 days. States he has noticed some blood but not a lot.

## 2016-12-16 NOTE — H&P (Signed)
[facility] [place] at [facility]   PATIENT NAME: Robert Dunlap    MR#:  [id number]  DATE OF BIRTH:  05-Nov-1990  DATE OF ADMISSION:  12/16/2016  PRIMARY CARE PHYSICIAN: Patient, No Pcp Per   REQUESTING/REFERRING PHYSICIAN: Dr. Dorothea Glassman  CHIEF COMPLAINT: Abdominal pain    Chief Complaint  Patient presents with  . Abdominal Pain    HISTORY OF PRESENT ILLNESS:  Frankie Goudy  is a 26 y.o. male with Past medical history presented to the ED because of severe abdominal pain and left lower quadrant going on for the past 3 days. Abdominal pain is cramping-like pain and sometimes going to the back and 10 out of 10 in severity relieved with pain medicine as given in the emergency room and worsened with bowel movements. Patient says that he's been having about 20-30 loose stools everydayPast 2 days associated with nausea, vomiting and unable to keep anything down. Have hypokalemia with potassium 3.3, sigmoid colon colitis on the CAT scan.  PAST MEDICAL HISTORY:  History reviewed. No pertinent past medical history.  PAST SURGICAL HISTOIRY:  History reviewed. No pertinent surgical history.  SOCIAL HISTORY:   Social History  Substance Use Topics  . Smoking status: Current Every Day Smoker  . Smokeless tobacco: Never Used  . Alcohol use Yes    FAMILY HISTORY:  No family history on file.  DRUG ALLERGIES:   Allergies  Allergen Reactions  . Hydrocodone-Acetaminophen Nausea And Vomiting    REVIEW OF SYSTEMS:  CONSTITUTIONAL: No fever, fatigue or weakness.  EYES: No blurred or double vision.  EARS, NOSE, AND THROAT: No tinnitus or ear pain.  RESPIRATORY: No cough, shortness of breath, wheezing or hemoptysis.  CARDIOVASCULAR: No chest pain, orthopnea, edema.  GASTROINTESTINALHe was left lower quadrant abdominal pain, nausea, vomiting, diarrhea going on for last 2-3 days, unable to keep anything down.Marland Kitchen  GENITOURINARY: No dysuria, hematuria.  ENDOCRINE:  No polyuria, nocturia,  HEMATOLOGY: No anemia, easy bruising or bleeding SKIN: No rash or lesion. MUSCULOSKELETAL: No joint pain or arthritis.   NEUROLOGIC: No tingling, numbness, weakness.  PSYCHIATRY: No anxiety or depression.   MEDICATIONS AT HOME:   Prior to Admission medications   Medication Sig Start Date End Date Taking? Authorizing Provider  brompheniramine-pseudoephedrine-DM 30-2-10 MG/5ML syrup Take 10 mLs by mouth 4 (four) times daily as needed. Patient not taking: Reported on 12/16/2016 03/16/16   Hagler, Jami L, PA-C  cyclobenzaprine (FLEXERIL) 10 MG tablet Take 1 tablet (10 mg total) by mouth every 8 (eight) hours as needed for muscle spasms. Patient not taking: Reported on 12/16/2016 12/18/14   Joni Reining, PA-C  fluticasone [facility]) 50 MCG/ACT nasal spray Place 2 sprays into both nostrils daily. Patient not taking: Reported on 12/16/2016 03/16/16   Hagler, Jami L, PA-C  naproxen (NAPROSYN) 500 MG tablet Take 1 tablet (500 mg total) by mouth 2 (two) times daily with a meal. Patient not taking: Reported on 12/16/2016 12/18/14   Joni Reining, PA-C  naproxen (NAPROSYN) 500 MG tablet Take 1 tablet (500 mg total) by mouth 2 (two) times daily with a meal. Patient not taking: Reported on 12/16/2016 09/29/15   Joni Reining, PA-C      VITAL SIGNS:  Blood pressure 120/69, pulse (!) 52, temperature 99.3 F (37.4 C), temperature source Oral, height 6\' 1"  (1.854 m), weight 88.5 kg (195 lb), SpO2 100 %.  PHYSICAL EXAMINATION:  GENERAL:  26 y.o.-year-old patient lying in the bed with no acute distress.  EYES: Pupils equal, round, reactive to light and accommodation. No scleral icterus. Extraocular muscles intact.  HEENT: Head atraumatic, normocephalic. Oropharynx and nasopharynx clear.  NECK:  Supple, no jugular venous distention. No thyroid enlargement, no tenderness.  LUNGS: Normal breath sounds bilaterally, no wheezing, rales,rhonchi or crepitation. No use of accessory muscles  of respiration.  CARDIOVASCULAR: S1, S2 normal. No murmurs, rubs, or gallops.  ABDOMEN: Patient has left lower quadrant abdominal tenderness present no rebound tenderness/organomegaly. Decreased bowel sounds.  EXTREMITIES: No pedal edema, cyanosis, or clubbing.  NEUROLOGIC: Cranial nerves II through XII are intact. Muscle strength 5/5 in all extremities. Sensation intact. Gait not checked.  PSYCHIATRIC: The patient is alert and oriented x 3.  SKIN: No obvious rash, lesion, or ulcer.   LABORATORY PANEL:   CBC  Recent Labs Lab 12/16/16 1200  WBC 4.7  HGB 15.6  HCT 44.7  PLT 190   ------------------------------------------------------------------------------------------------------------------  Chemistries   Recent Labs Lab 12/16/16 1200  NA 140  K 3.3*  CL 103  CO2 30  GLUCOSE 111*  BUN 8  CREATININE 0.89  CALCIUM 9.0  AST 21  ALT 19  ALKPHOS 52  BILITOT 0.4   ------------------------------------------------------------------------------------------------------------------  Cardiac Enzymes No results for input(s): TROPONINI in the last 168 hours. ------------------------------------------------------------------------------------------------------------------  RADIOLOGY:  Ct Abdomen Pelvis W Contrast  Result Date: 12/16/2016 CLINICAL DATA:  Left lower quadrant abdominal pain with nausea and vomiting EXAM: CT ABDOMEN AND PELVIS WITH CONTRAST TECHNIQUE: Multidetector CT imaging of the abdomen and pelvis was performed using the standard protocol following bolus administration of intravenous contrast. Oral contrast was also administered. CONTRAST:  ISOVUE-300 IOPAMIDOL (ISOVUE-300) INJECTION 61% COMPARISON:  None. FINDINGS: Lower chest: Lung bases are clear. Hepatobiliary: No focal liver lesions are apparent. Gallbladder wall is not thickened. There is no biliary duct dilatation. Pancreas: No pancreatic mass or inflammatory focus. Spleen: Spleen measures 13.0 x 13.6  x 6.6 cm with a measured splenic volume of 583 cubic cm. No focal splenic lesions are evident. Adrenals/Urinary Tract: Adrenals bilaterally appear normal. Kidneys bilaterally show no mass or hydronephrosis on either side. There is no renal or ureteral calculus on either side. Urinary bladder is midline with wall thickness within normal limits. Stomach/Bowel: There is wall thickening in the distal descending colon and proximal sigmoid colon. There is surrounding mesenteric inflammation in the proximal sigmoid region consistent with colitis. No well-defined diverticular noted in this area to indicate that this inflammation is due to diverticulitis. There is decompression of the more distal sigmoid colon. There is no other bowel wall or mesenteric thickening. There is no evident bowel obstruction. No free air or portal venous air. Vascular/Lymphatic: There is no abdominal aortic aneurysm. No vascular lesions are evident. There is no adenopathy in the abdomen or pelvis. Reproductive: Prostate and seminal vesicles appear normal in size and contour. No pelvic mass. Other: Appendix appears unremarkable. There is no abscess or ascites in the abdomen or pelvis. Musculoskeletal: There is mild lumbar levoscoliosis. There are no blastic or lytic bone lesions. There is no intramuscular or abdominal wall lesion. IMPRESSION: 1. Distal descending colon and proximal sigmoid colon colitis with mesenteric thickening most notable in the proximal sigmoid colon. No irregular diverticular noted to indicate diverticulitis as opposed to nonspecific colitis. No perforation or abscess evident. There are is no similar bowel wall or mesenteric thickening elsewhere. No bowel obstruction. 2. No abscess elsewhere in the abdomen or pelvis. Appendix appears normal. 3. Splenomegaly of uncertain etiology. No focal splenic lesions are evident. 4.  No renal or ureteral calculus.  No hydronephrosis. Electronically Signed   By: Bretta Bang III M.D.    On: 12/16/2016 14:13    EKG:  No orders found for this or any previous visit.  IMPRESSION AND PLAN:  #36.26 year old male patient with severe abdominal pain left lower quadrant for last 2-3 days with nausea, vomiting, diarrhea evidence of sigmoid colon colitis on the the abdomen most likely diverticulitis. Patient will be admitted to hospitalist service under observation, continue gentle hydration, empiric antibiotics for next 24 hours, replace the potassium for hypokalemia. Continue morphine for pain control. Note that patient is allergic to hydrocodone but he told me that he gets only nausea with narcotics. So continue morphine 2 mg every 4 hours as needed and also use Zofran or Phenergan for nausea. #2 .tobacco abuse: Patient is counseled to quit smoking, offered assistance. Counseled about 10 minutes. All the records are reviewed and case discussed with ED provider. Management plans discussed with the patient, family and they are in agreement.  CODE STATUS: full  TOTAL TIME TAKING CARE OF THIS PATIENT: 55 minutes.    Katha Hamming M.D on 12/16/2016 at 3:24 PM  Between 7am to 6pm - Pager - [phone]  After 6pm go to [email or web address] - password [email or web address]  [facility]  Office  [phone]  CC: Primary care physician; Patient, No Pcp Per  Note: This dictation was prepared with Dragon dictation along with smaller phrase technology. Any transcriptional errors that result from this process are unintentional.

## 2016-12-16 NOTE — ED Notes (Signed)
Patient transported to CT 

## 2016-12-17 LAB — CBC
HEMATOCRIT: 39.1 % — AB (ref 40.0–52.0)
Hemoglobin: 13.6 g/dL (ref 13.0–18.0)
MCH: 30.4 pg (ref 26.0–34.0)
MCHC: 34.9 g/dL (ref 32.0–36.0)
MCV: 87 fL (ref 80.0–100.0)
Platelets: 172 10*3/uL (ref 150–440)
RBC: 4.49 MIL/uL (ref 4.40–5.90)
RDW: 13.4 % (ref 11.5–14.5)
WBC: 5 10*3/uL (ref 3.8–10.6)

## 2016-12-17 LAB — BASIC METABOLIC PANEL
Anion gap: 5 (ref 5–15)
BUN: 6 mg/dL (ref 6–20)
CHLORIDE: 109 mmol/L (ref 101–111)
CO2: 28 mmol/L (ref 22–32)
CREATININE: 0.9 mg/dL (ref 0.61–1.24)
Calcium: 8.5 mg/dL — ABNORMAL LOW (ref 8.9–10.3)
GFR calc non Af Amer: >60 mL/min (ref >60)
Glucose, Bld: 109 mg/dL — ABNORMAL HIGH (ref 65–99)
POTASSIUM: 3.4 mmol/L — AB (ref 3.5–5.1)
SODIUM: 142 mmol/L (ref 135–145)

## 2016-12-17 LAB — GLUCOSE, CAPILLARY: GLUCOSE-CAPILLARY: 100 mg/dL — AB (ref 65–99)

## 2016-12-17 LAB — MAGNESIUM: MAGNESIUM: 1.8 mg/dL (ref 1.7–2.4)

## 2016-12-17 MED ORDER — CIPROFLOXACIN HCL 500 MG PO TABS: 500.0000 mg | ORAL_TABLET | Freq: Two times a day (BID) | ORAL | 0 refills | Status: DC

## 2016-12-17 MED ORDER — OXYCODONE-ACETAMINOPHEN 5-325 MG PO TABS: 1.0000 | ORAL_TABLET | Freq: Three times a day (TID) | ORAL | 0 refills | Status: DC | PRN

## 2016-12-17 MED ORDER — POTASSIUM CHLORIDE CRYS ER 20 MEQ PO TBCR: 40.0000 meq | EXTENDED_RELEASE_TABLET | Freq: Once | ORAL | Status: DC

## 2016-12-17 MED ORDER — OXYCODONE-ACETAMINOPHEN 5-325 MG PO TABS: 1.0000 | ORAL_TABLET | Freq: Three times a day (TID) | ORAL | Status: DC | PRN

## 2016-12-17 MED ORDER — METRONIDAZOLE 500 MG PO TABS: 500.0000 mg | ORAL_TABLET | Freq: Three times a day (TID) | ORAL | Status: DC

## 2016-12-17 MED ORDER — METRONIDAZOLE 500 MG PO TABS: 500.0000 mg | ORAL_TABLET | Freq: Three times a day (TID) | ORAL | 0 refills | Status: DC

## 2016-12-17 MED ORDER — CIPROFLOXACIN HCL 500 MG PO TABS: 500.0000 mg | ORAL_TABLET | Freq: Two times a day (BID) | ORAL | Status: DC

## 2016-12-17 NOTE — Discharge Summary (Signed)
[facility] [employer] at [facility]   PATIENT NAME: Robert Dunlap    MR#:  [id number]  DATE OF BIRTH:  1991/03/06  DATE OF ADMISSION:  12/16/2016   ADMITTING PHYSICIAN: Katha Hamming, MD  DATE OF DISCHARGE: 12/17/2016 11:03 AM  PRIMARY CARE PHYSICIAN: Patient, No Pcp Per   ADMISSION DIAGNOSIS:  Colitis [K52.9] DISCHARGE DIAGNOSIS:  Active Problems:   Colitis  SECONDARY DIAGNOSIS:  History reviewed. No pertinent past medical history. HOSPITAL COURSE:   #68.26 year old male patient with severe abdominal pain left lower quadrant for last 2-3 days with nausea, vomiting, diarrhea evidence of sigmoid colon colitis on the the abdomen most likely diverticulitis.  C. Difficile test is negative. He was treated with rehydration, empiric antibiotics, replaced the potassium for hypokalemia, Percocet and morphine for pain control. Note that patient is allergic to hydrocodone but he told me that he gets only nausea with narcotics.  Zofran or Phenergan for nausea. He symptoms has much improved.He tolerated diet. Continue Cipro and Flagyl for 5 more days.  #2 .tobacco abuse: Patient is counseled to quit smoking, offered assistance.  DISCHARGE CONDITIONS:  Stable, discharged to home today. CONSULTS OBTAINED:   DRUG ALLERGIES:   Allergies  Allergen Reactions  . Hydrocodone-Acetaminophen Nausea And Vomiting   DISCHARGE MEDICATIONS:   Allergies as of 12/17/2016      Reactions   Hydrocodone-acetaminophen Nausea And Vomiting      Medication List    STOP taking these medications   brompheniramine-pseudoephedrine-DM 30-2-10 MG/5ML syrup   cyclobenzaprine 10 MG tablet Commonly known as:  FLEXERIL   fluticasone 50 MCG/ACT nasal spray Commonly known as:  FLONASE   naproxen 500 MG tablet Commonly known as:  NAPROSYN     TAKE these medications   ciprofloxacin 500 MG tablet Commonly known as:  CIPRO Take 1 tablet (500 mg total) by mouth 2 (two) times daily.     metroNIDAZOLE 500 MG tablet Commonly known as:  FLAGYL Take 1 tablet (500 mg total) by mouth every 8 (eight) hours.   oxyCODONE-acetaminophen 5-325 MG tablet Commonly known as:  PERCOCET/ROXICET Take 1 tablet by mouth every 8 (eight) hours as needed for moderate pain.            Discharge Care Instructions        Start     Ordered   12/17/16 0000  ciprofloxacin (CIPRO) 500 MG tablet  2 times daily     12/17/16 0913   12/17/16 0000  metroNIDAZOLE (FLAGYL) 500 MG tablet  Every 8 hours     12/17/16 0913   12/17/16 0000  Increase activity slowly     12/17/16 0915   12/17/16 0000  Diet - low sodium heart healthy     12/17/16 0915   12/17/16 0000  oxyCODONE-acetaminophen (PERCOCET/ROXICET) 5-325 MG tablet  Every 8 hours PRN     12/17/16 4098       DISCHARGE INSTRUCTIONS:  See AVS  If you experience worsening of your admission symptoms, develop shortness of breath, life threatening emergency, suicidal or homicidal thoughts you must seek medical attention immediately by calling 911 or calling your MD immediately  if symptoms less severe.  You Must read complete instructions/literature along with all the possible adverse reactions/side effects for all the Medicines you take and that have been prescribed to you. Take any new Medicines after you have completely understood and accpet all the possible adverse reactions/side effects.   Please note  You were cared for by a hospitalist during your  hospital stay. If you have any questions about your discharge medications or the care you received while you were in the hospital after you are discharged, you can call the unit and asked to speak with the hospitalist on call if the hospitalist that took care of you is not available. Once you are discharged, your primary care physician will handle any further medical issues. Please note that NO REFILLS for any discharge medications will be authorized once you are discharged, as it is imperative  that you return to your primary care physician (or establish a relationship with a primary care physician if you do not have one) for your aftercare needs so that they can reassess your need for medications and monitor your lab values.    On the day of Discharge:  VITAL SIGNS:  Blood pressure (!) 113/50, pulse 61, temperature 97.7 F (36.5 C), temperature source Oral, resp. rate 17, height 6\' 1"  (1.854 m), weight 206 lb 11.2 oz (93.8 kg), SpO2 98 %. PHYSICAL EXAMINATION:  GENERAL:  26 y.o.-year-old patient lying in the bed with no acute distress.  EYES: Pupils equal, round, reactive to light and accommodation. No scleral icterus. Extraocular muscles intact.  HEENT: Head atraumatic, normocephalic. Oropharynx and nasopharynx clear.  NECK:  Supple, no jugular venous distention. No thyroid enlargement, no tenderness.  LUNGS: Normal breath sounds bilaterally, no wheezing, rales,rhonchi or crepitation. No use of accessory muscles of respiration.  CARDIOVASCULAR: S1, S2 normal. No murmurs, rubs, or gallops.  ABDOMEN: Soft, non-tender, non-distended. Bowel sounds present. No organomegaly or mass.  EXTREMITIES: No pedal edema, cyanosis, or clubbing.  NEUROLOGIC: Cranial nerves II through XII are intact. Muscle strength 5/5 in all extremities. Sensation intact. Gait not checked.  PSYCHIATRIC: The patient is alert and oriented x 3.  SKIN: No obvious rash, lesion, or ulcer.  DATA REVIEW:   CBC  Recent Labs Lab 12/17/16 0319  WBC 5.0  HGB 13.6  HCT 39.1*  PLT 172    Chemistries   Recent Labs Lab 12/16/16 1200 12/17/16 0319  NA 140 142  K 3.3* 3.4*  CL 103 109  CO2 30 28  GLUCOSE 111* 109*  BUN 8 6  CREATININE 0.89 0.90  CALCIUM 9.0 8.5*  MG  --  1.8  AST 21  --   ALT 19  --   ALKPHOS 52  --   BILITOT 0.4  --      Microbiology Results  Results for orders placed or performed during the hospital encounter of 12/16/16  C difficile quick scan w PCR reflex     Status: None    Collection Time: 12/16/16  7:45 PM  Result Value Ref Range Status   C Diff antigen NEGATIVE NEGATIVE Final   C Diff toxin NEGATIVE NEGATIVE Final   C Diff interpretation No C. difficile detected.  Final    RADIOLOGY:  No results found.   Management plans discussed with the patient, family and they are in agreement.  CODE STATUS: Prior   TOTAL TIME TAKING CARE OF THIS PATIENT: 32 minutes.    Shaune Pollack M.D on 12/17/2016 at 5:57 PM  Between 7am to 6pm - Pager - [phone]  After 6pm go to [email or web address] - Social research officer, government  [facility]  Office  [phone]  CC: Primary care physician; Patient, No Pcp Per   Note: This dictation was prepared with Dragon dictation along with smaller phrase technology. Any transcriptional errors that result from this process are unintentional.

## 2016-12-17 NOTE — Discharge Instructions (Addendum)
Colitis Colitis is inflammation of the colon. Colitis may last a short time (acute) or it may last a long time (chronic). What are the causes? This condition may be caused by:  Viruses.  Bacteria.  Reactions to medicine.  Certain autoimmune diseases, such as Crohn disease or ulcerative colitis.  What are the signs or symptoms? Symptoms of this condition include:  Diarrhea.  Passing bloody or tarry stool.  Pain.  Fever.  Vomiting.  Tiredness (fatigue).  Weight loss.  Bloating.  Sudden increase in abdominal pain.  Having fewer bowel movements than usual.  How is this diagnosed? This condition is diagnosed with a stool test or a blood test. You may also have other tests, including X-rays, a CT scan, or a colonoscopy. How is this treated? Treatment may include:  Resting the bowel. This involves not eating or drinking for a period of time.  Fluids that are given through an IV tube.  Medicine for pain and diarrhea.  Antibiotic medicines.  Cortisone medicines.  Surgery.  Follow these instructions at home: Eating and drinking  Follow instructions from your health care provider about eating or drinking restrictions.  Drink enough fluid to keep your urine clear or pale yellow.  Work with a dietitian to determine which foods cause your condition to flare up.  Avoid foods that cause flare-ups.  Eat a well-balanced diet. Medicines  Take over-the-counter and prescription medicines only as told by your health care provider.  If you were prescribed an antibiotic medicine, take it as told by your health care provider. Do not stop taking the antibiotic even if you start to feel better. General instructions  Keep all follow-up visits as told by your health care provider. This is important. Contact a health care provider if:  Your symptoms do not go away.  You develop new symptoms. Get help right away if:  You have a fever that does not go away with  treatment.  You develop chills.  You have extreme weakness, fainting, or dehydration.  You have repeated vomiting.  You develop severe pain in your abdomen.  You pass bloody or tarry stool. This information is not intended to replace advice given to you by your health care provider. Make sure you discuss any questions you have with your health care provider. Document Released: 05/20/2004 Document Revised: 09/18/2015 Document Reviewed: 08/05/2014 Elsevier Interactive Patient Education  2018 [employer].  [employer]. Smoking cessation.

## 2016-12-17 NOTE — Progress Notes (Signed)
  December 17, 2016  Patient: Robert Dunlap  Date of Birth: 1990-06-17  Date of Visit: 12/16/2016    To Whom It May Concern:  Jakobee Snyder was seen and treated at [facility] part of [facility] care center on 12/16/2016. CEVIN KUTZ  may return to work on Monday 12/20/16. He has been cared for by Hospitalist Dr. Imogene Burn, MD.   Sincerely,  Essie Hart, RN

## 2016-12-17 NOTE — Progress Notes (Signed)
Spoke with pt regarding smoking cessation. Pt given Handout on Colitis.

## 2016-12-18 LAB — HIV ANTIBODY (ROUTINE TESTING W REFLEX): HIV Screen 4th Generation wRfx: NONREACTIVE

## 2017-05-22 ENCOUNTER — Emergency Department
Admission: EM | Admit: 2017-05-22 | Discharge: 2017-05-22 | Disposition: A | Discharge status: Home / Self Care | Payer: Self-pay | Attending: Emergency Medicine | Admitting: Emergency Medicine

## 2017-05-22 ENCOUNTER — Encounter: Payer: Self-pay | Admitting: Emergency Medicine

## 2017-05-22 DIAGNOSIS — F17200 Nicotine dependence, unspecified, uncomplicated: Secondary | ICD-10-CM | POA: Insufficient documentation

## 2017-05-22 DIAGNOSIS — Z79899 Other long term (current) drug therapy: Secondary | ICD-10-CM | POA: Insufficient documentation

## 2017-05-22 DIAGNOSIS — F10920 Alcohol use, unspecified with intoxication, uncomplicated: Secondary | ICD-10-CM | POA: Insufficient documentation

## 2017-05-22 LAB — BASIC METABOLIC PANEL
ANION GAP: 12 (ref 5–15)
BUN: 20 mg/dL (ref 6–20)
CALCIUM: 9.2 mg/dL (ref 8.9–10.3)
CO2: 24 mmol/L (ref 22–32)
Chloride: 107 mmol/L (ref 101–111)
Creatinine, Ser: 0.71 mg/dL (ref 0.61–1.24)
GFR calc non Af Amer: >60 mL/min (ref >60)
Glucose, Bld: 108 mg/dL — ABNORMAL HIGH (ref 65–99)
POTASSIUM: 3.3 mmol/L — AB (ref 3.5–5.1)
Sodium: 143 mmol/L (ref 135–145)

## 2017-05-22 LAB — CBC
HEMATOCRIT: 48.3 % (ref 40.0–52.0)
HEMOGLOBIN: 16.4 g/dL (ref 13.0–18.0)
MCH: 29.9 pg (ref 26.0–34.0)
MCHC: 34 g/dL (ref 32.0–36.0)
MCV: 87.8 fL (ref 80.0–100.0)
Platelets: 259 10*3/uL (ref 150–440)
RBC: 5.5 MIL/uL (ref 4.40–5.90)
RDW: 13.2 % (ref 11.5–14.5)
WBC: 9.9 10*3/uL (ref 3.8–10.6)

## 2017-05-22 LAB — ETHANOL: Alcohol, Ethyl (B): 227 mg/dL — ABNORMAL HIGH (ref <10)

## 2017-05-22 MED ORDER — ONDANSETRON HCL 4 MG/2ML IJ SOLN
4.0000 mg | Freq: Once | INTRAMUSCULAR | Status: AC
  Administered 2017-05-22: 4 mg via INTRAVENOUS
  Filled 2017-05-22: qty 2

## 2017-05-22 MED ORDER — ONDANSETRON 4 MG PO TBDP: 4.0000 mg | ORAL_TABLET | Freq: Three times a day (TID) | ORAL | 0 refills | Status: DC | PRN

## 2017-05-22 MED ORDER — SODIUM CHLORIDE 0.9 % IV BOLUS (SEPSIS)
1000.0000 mL | Freq: Once | INTRAVENOUS | Status: AC
  Administered 2017-05-22: 1000 mL via INTRAVENOUS

## 2017-05-22 NOTE — ED Triage Notes (Signed)
Patient was drinking at bar tonight with unknown amount or type.  Pt was brought to ER and pulled out of car by staff.  Patient is intoxicated but responsive to pain stimuli. Fiancee at bedside.

## 2017-05-22 NOTE — ED Provider Notes (Signed)
[facility] Emergency Department Provider Note  Time seen: 1:54 AM  I have reviewed the triage vital signs and the nursing notes.   HISTORY  Chief Complaint No chief complaint on file.    HPI Robert Dunlap is a 27 y.o. male with no known past medical history presents to the emergency department for possible alcohol poisoning.  According to report patient was brought by his girlfriend, states they were out drinking tonight at a bar patient had become incoherent with significant vomiting they brought to the emergency department.  In the waiting room patient very somnolent with active vomiting so they brought the patient immediately back to a room.  Here the patient is extremely somnolent, will awaken to painful stimuli briefly, moaning roll away.  Not able to answer questions.  Not following commands.  Smells of alcohol and vomit.  No past medical history on file.  Patient Active Problem List   Diagnosis Date Noted  . Colitis 12/16/2016    No past surgical history on file.  Prior to Admission medications   Medication Sig Start Date End Date Taking? Authorizing Provider  ciprofloxacin (CIPRO) 500 MG tablet Take 1 tablet (500 mg total) by mouth 2 (two) times daily. 12/17/16   Shaune Pollackhen, Qing, MD  metroNIDAZOLE (FLAGYL) 500 MG tablet Take 1 tablet (500 mg total) by mouth every 8 (eight) hours. 12/17/16   Shaune Pollackhen, Qing, MD  oxyCODONE-acetaminophen (PERCOCET/ROXICET) 5-325 MG tablet Take 1 tablet by mouth every 8 (eight) hours as needed for moderate pain. 12/17/16   Shaune Pollackhen, Qing, MD    Allergies  Allergen Reactions  . Hydrocodone-Acetaminophen Nausea And Vomiting    No family history on file.  Social History Social History   Tobacco Use  . Smoking status: Current Every Day Smoker  . Smokeless tobacco: Never Used  Substance Use Topics  . Alcohol use: Yes  . Drug use: No    Review of Systems Unable to obtain a review of systems due to altered mental status/acute  intoxication  ____________________________________________   PHYSICAL EXAM:  Constitutional: Patient is very somnolent, keeps eyes closed, occasionally will moan.  Will not answer questions or follow commands. Eyes: Normal exam ENT   Head: Normocephalic and atraumatic.   Mouth/Throat: Mucous membranes are moist. Cardiovascular: Normal rate, regular rhythm. No murmur Respiratory: Normal respiratory effort without tachypnea nor retractions. Breath sounds are clear  Gastrointestinal: Soft and nontender. No distention.  No reaction to abdominal palpation.  Vomit on clothes Musculoskeletal: Nontender with normal range of motion in all extremities. Neurologic: Patient very somnolent, will occasionally moan or move away.  Appears to move all extremities. Skin:  Skin is warm, dry and intact.  Psychiatric: Altered mental status  ____________________________________________   INITIAL IMPRESSION / ASSESSMENT AND PLAN / ED COURSE  Pertinent labs & imaging results that were available during my care of the patient were reviewed by me and considered in my medical decision making (see chart for details).  Patient presents to the emergency department with vomiting and somnolence.  Girlfriend states they were drinking heavily tonight.  Girlfriend denies any drug use.  Patient is not able to answer questions or follow commands.  We will check labs, IV hydrate, treat with Zofran and continue to closely monitor.  Differential would include substance use, alcohol intoxication, metabolic abnormality.  Patient's labs are resulted showing significant alcohol elevation of 227.  Patient is now awake, talking.  States he wants disclosed that he wants to go home.  Patient's girlfriend is  here with the patient.  Patient is ripped of the leads off of his chest, IV is been pulled loose.  Girlfriend also states they want to go home now.  They have a sober friend who is here he is going to take them both home.  I  discussed with the patient given his significant alcohol elevation and initial significant vomiting I recommend we continue to watch him here in the emergency department for several hours.  Patient states "give me my clothes I want to go home."  We will discharge from the emergency department at this time.  ____________________________________________   FINAL CLINICAL IMPRESSION(S) / ED DIAGNOSES  Acute alcohol intoxication Altered mental status    Minna Antis, MD 05/22/17 [phone]

## 2017-12-13 ENCOUNTER — Other Ambulatory Visit: Payer: Self-pay

## 2017-12-13 ENCOUNTER — Emergency Department
Admission: EM | Admit: 2017-12-13 | Discharge: 2017-12-13 | Discharge status: Against Medical Advice | Payer: Self-pay | Attending: Emergency Medicine | Admitting: Emergency Medicine

## 2017-12-13 ENCOUNTER — Encounter: Payer: Self-pay | Admitting: *Deleted

## 2017-12-13 DIAGNOSIS — R519 Headache, unspecified: Secondary | ICD-10-CM

## 2017-12-13 DIAGNOSIS — R51 Headache: Secondary | ICD-10-CM | POA: Insufficient documentation

## 2017-12-13 DIAGNOSIS — R21 Rash and other nonspecific skin eruption: Secondary | ICD-10-CM | POA: Insufficient documentation

## 2017-12-13 DIAGNOSIS — Z5320 Procedure and treatment not carried out because of patient's decision for unspecified reasons: Secondary | ICD-10-CM | POA: Insufficient documentation

## 2017-12-13 DIAGNOSIS — F17200 Nicotine dependence, unspecified, uncomplicated: Secondary | ICD-10-CM | POA: Insufficient documentation

## 2017-12-13 MED ORDER — SODIUM CHLORIDE 0.9 % IV BOLUS: 1000.0000 mL | Freq: Once | INTRAVENOUS | Status: DC

## 2017-12-13 NOTE — ED Notes (Signed)
This RN came in to start pt's IV. Pt then stated that he had to get home to his wife and kids and that he would like to leave despite still having the headache. Pt advised to return to ED if he does not get any better and drink plenty of water. EDP made aware.

## 2017-12-13 NOTE — ED Provider Notes (Signed)
[facility] Emergency Department Provider Note  ____________________________________________   I have reviewed the triage vital signs and the nursing notes. Where available I have reviewed prior notes and, if possible and indicated, outside hospital notes.    HISTORY  Chief Complaint Headache and Rash    HPI Robert Dunlap is a 27 y.o. male  States that he has been having headaches off and on for the last month or 2 during the heat.  Is worse when he works in the heat.  He also states that he has had a diffuse rash mostly on his trunk that is been there for the same amount of time almost, he states that he has had no fever no chills no stiff neck, headache is not the worst headaches of life, the gradual in onset, they seem to be happening when he gets dehydrated at work.  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  He states he has had no tick bites, the rash does not involve the palms or soles.  He denies any focal neurologic deficit numbness or weakness, the headache is just a gradual mild headache that seems to nag him until he gets enough fluid and after his hard day of work.  He denies any STI exposure.  He has no rash on his penis.  He states that this time the rash is nearly gone.  Sometimes it seems a little prickly.  He states that he has no other complaints or issues.     History reviewed. No pertinent past medical history.  Patient Active Problem List   Diagnosis Date Noted  . Colitis 12/16/2016    History reviewed. No pertinent surgical history.  Prior to Admission medications   Medication Sig Start Date End Date Taking? Authorizing Provider  ciprofloxacin (CIPRO) 500 MG tablet Take 1 tablet (500 mg total) by mouth 2 (two) times daily. Patient not taking: Reported on 05/22/2017 12/17/16   Shaune Pollackhen, Qing, MD  metroNIDAZOLE (FLAGYL) 500 MG tablet Take 1 tablet (500 mg total) by mouth every 8 (eight) hours. Patient not taking: Reported on 05/22/2017  12/17/16   Shaune Pollackhen, Qing, MD  ondansetron (ZOFRAN ODT) 4 MG disintegrating tablet Take 1 tablet (4 mg total) by mouth every 8 (eight) hours as needed for nausea or vomiting. 05/22/17   Minna AntisPaduchowski, Kevin, MD  oxyCODONE-acetaminophen (PERCOCET/ROXICET) 5-325 MG tablet Take 1 tablet by mouth every 8 (eight) hours as needed for moderate pain. Patient not taking: Reported on 05/22/2017 12/17/16   Shaune Pollackhen, Qing, MD    Allergies Hydrocodone-acetaminophen  History reviewed. No pertinent family history.  Social History Social History   Tobacco Use  . Smoking status: Current Every Day Smoker  . Smokeless tobacco: Never Used  Substance Use Topics  . Alcohol use: Yes  . Drug use: No    Review of Systems Constitutional: No fever/chills Eyes: No visual changes. ENT: No sore throat. No stiff neck no neck pain Cardiovascular: Denies chest pain. Respiratory: Denies shortness of breath. Gastrointestinal:   no vomiting.  No diarrhea.  No constipation. Genitourinary: Negative for dysuria. Musculoskeletal: Negative lower extremity swelling Skin: +_ for rash. Neurological: Negative for severe headaches, focal weakness or numbness.   ____________________________________________   PHYSICAL EXAM:  VITAL SIGNS: ED Triage Vitals  Enc Vitals Group     BP 12/13/17 1820 128/60     Pulse Rate 12/13/17 1820 (!) 46     Resp 12/13/17 1820 16     Temp 12/13/17 1820 98.4 F (36.9 C)  Temp Source 12/13/17 1820 Oral     SpO2 12/13/17 1820 98 %     Weight 12/13/17 1818 220 lb (99.8 kg)     Height 12/13/17 1818 6\' 1"  (1.854 m)     Head Circumference --      Peak Flow --      Pain Score 12/13/17 1818 6     Pain Loc --      Pain Edu? --      Excl. in GC? --     Constitutional: Alert and oriented. Well appearing and in no acute distress. Eyes: Conjunctivae are normal Head: Atraumatic HEENT: No congestion/rhinnorhea. Mucous membranes are moist.  Oropharynx non-erythematous Neck:   Nontender with no  meningismus, no masses, no stridor Cardiovascular: Normal rate, regular rhythm. Grossly normal heart sounds.  Good peripheral circulation. Respiratory: Normal respiratory effort.  No retractions. Lungs CTAB. Abdominal: Soft and nontender. No distention. No guarding no rebound Back:  There is no focal tenderness or step off.  there is no midline tenderness there are no lesions noted. there is no CVA tenderness GU: Normal external genitalia no lesions noted Musculoskeletal: No lower extremity tenderness, no upper extremity tenderness. No joint effusions, no DVT signs strong distal pulses no edema Neurologic:  Normal speech and language. No gross focal neurologic deficits are appreciated.  Skin:  Skin is warm, dry and intact.  Faint diffuse mild red rash blanchable spares palms and soles, on the torso and the groin.  Very sparse.  Occasional very faint lesions noted.  They are not raised they are not indurated looks like an old maculopapular rash. Psychiatric: Mood and affect are normal. Speech and behavior are normal.  ____________________________________________   LABS (all labs ordered are listed, but only abnormal results are displayed)  Labs Reviewed  RPR  CK  CBC WITH DIFFERENTIAL/PLATELET  BASIC METABOLIC PANEL    Pertinent labs  results that were available during my care of the patient were reviewed by me and considered in my medical decision making (see chart for details). ____________________________________________  EKG  I personally interpreted any EKGs ordered by me or triage  ____________________________________________  RADIOLOGY  Pertinent labs & imaging results that were available during my care of the patient were reviewed by me and considered in my medical decision making (see chart for details). If possible, patient and/or family made aware of any abnormal findings.  No results found. ____________________________________________    PROCEDURES  Procedure(s)  performed: None  Procedures  Critical Care performed: None  ____________________________________________   INITIAL IMPRESSION / ASSESSMENT AND PLAN / ED COURSE  Pertinent labs & imaging results that were available during my care of the patient were reviewed by me and considered in my medical decision making (see chart for details).  Well-appearing gentleman complains of feeling somewhat dehydrated today after working outside has a chronic rash and a slight headache.  I do not think he has meningitis certainly not with headaches for a month or 2.  I offered extensive work-up and even ordered IV fluids total CK and further evaluation but he refuses.  We talked about lumbar puncture and he refuses.  We talked about CT scan and he refuses.  I will I do not think CT scan is really indicated at this time anyway but it was part of her discussion of emergency department work-up a headache.  Patient understands the risk benefits and alternatives to refusing all of these interventions and he is very comfortable with going home and drinking fluid, which  is his plan.Marland Kitchen.  His rash is certainly nontoxic in appearance but does not seem to be consistent with a syphilitic pathology but I did order an RPR which he is declining to  obtain, I have also ordered total CK although he does not appear to be in rhabdo certainly something rechecking it for him and he declines.  In addition, there is no evidence of tickborne illness, and certainly nothing to suggest RMSF but we have discussed that possibility and he would prefer just to go home.  This is not atypical from prior presentations where he is left precipitously from the department.  I have tried my best to explain to him that he feels worse in any way he can come back and we will have him follow closely up as an outpatient.  Patient is electing to refuse all intervention he states he feels fine and, in fact, he looks fine, and we will discharge him therefore at his  request for    ____________________________________________   FINAL CLINICAL IMPRESSION(S) / ED DIAGNOSES  Final diagnoses:  None      This chart was dictated using voice recognition software.  Despite best efforts to proofread,  errors can occur which can change meaning.      Jeanmarie PlantMcShane, Keenya Matera A, MD 12/13/17 2036

## 2017-12-13 NOTE — ED Triage Notes (Signed)
PT to ED reporting headache x 3 days intermittently. No hx of migraines. Tylenol taken at home without relief. No neuro deficits noted but pt reports the pain radiates into his neck.   Rash is covering pts torso. Mild redness noted. PT reports the rash has been intermittent for the past month. No fevers.

## 2017-12-15 ENCOUNTER — Emergency Department: Payer: Self-pay

## 2017-12-15 ENCOUNTER — Emergency Department
Admission: EM | Admit: 2017-12-15 | Discharge: 2017-12-15 | Disposition: A | Discharge status: Home / Self Care | Payer: Self-pay | Attending: Emergency Medicine | Admitting: Emergency Medicine

## 2017-12-15 ENCOUNTER — Other Ambulatory Visit: Payer: Self-pay

## 2017-12-15 ENCOUNTER — Encounter: Payer: Self-pay | Admitting: Emergency Medicine

## 2017-12-15 DIAGNOSIS — W57XXXA Bitten or stung by nonvenomous insect and other nonvenomous arthropods, initial encounter: Secondary | ICD-10-CM | POA: Insufficient documentation

## 2017-12-15 DIAGNOSIS — B349 Viral infection, unspecified: Secondary | ICD-10-CM | POA: Insufficient documentation

## 2017-12-15 DIAGNOSIS — S30861A Insect bite (nonvenomous) of abdominal wall, initial encounter: Secondary | ICD-10-CM | POA: Insufficient documentation

## 2017-12-15 DIAGNOSIS — Y9389 Activity, other specified: Secondary | ICD-10-CM | POA: Insufficient documentation

## 2017-12-15 DIAGNOSIS — F17200 Nicotine dependence, unspecified, uncomplicated: Secondary | ICD-10-CM | POA: Insufficient documentation

## 2017-12-15 DIAGNOSIS — Y929 Unspecified place or not applicable: Secondary | ICD-10-CM | POA: Insufficient documentation

## 2017-12-15 DIAGNOSIS — Y998 Other external cause status: Secondary | ICD-10-CM | POA: Insufficient documentation

## 2017-12-15 DIAGNOSIS — R51 Headache: Secondary | ICD-10-CM | POA: Insufficient documentation

## 2017-12-15 LAB — COMPREHENSIVE METABOLIC PANEL
ALBUMIN: 4.2 g/dL (ref 3.5–5.0)
ALK PHOS: 58 U/L (ref 38–126)
ALT: 38 U/L (ref 0–44)
AST: 29 U/L (ref 15–41)
Anion gap: 6 (ref 5–15)
BILIRUBIN TOTAL: 0.3 mg/dL (ref 0.3–1.2)
BUN: 10 mg/dL (ref 6–20)
CALCIUM: 9.2 mg/dL (ref 8.9–10.3)
CO2: 28 mmol/L (ref 22–32)
Chloride: 106 mmol/L (ref 98–111)
Creatinine, Ser: 0.83 mg/dL (ref 0.61–1.24)
GFR calc Af Amer: >60 mL/min (ref >60)
GFR calc non Af Amer: >60 mL/min (ref >60)
GLUCOSE: 90 mg/dL (ref 70–99)
Potassium: 3.7 mmol/L (ref 3.5–5.1)
SODIUM: 140 mmol/L (ref 135–145)
TOTAL PROTEIN: 7.1 g/dL (ref 6.5–8.1)

## 2017-12-15 LAB — CBC
HEMATOCRIT: 44.5 % (ref 40.0–52.0)
HEMOGLOBIN: 15.4 g/dL (ref 13.0–18.0)
MCH: 30.5 pg (ref 26.0–34.0)
MCHC: 34.5 g/dL (ref 32.0–36.0)
MCV: 88.3 fL (ref 80.0–100.0)
Platelets: 172 10*3/uL (ref 150–440)
RBC: 5.04 MIL/uL (ref 4.40–5.90)
RDW: 13.3 % (ref 11.5–14.5)
WBC: 4.6 10*3/uL (ref 3.8–10.6)

## 2017-12-15 LAB — CK: Total CK: 64 U/L (ref 49–397)

## 2017-12-15 MED ORDER — DOXYCYCLINE HYCLATE 100 MG PO TABS: 100.0000 mg | ORAL_TABLET | Freq: Two times a day (BID) | ORAL | 0 refills | Status: DC

## 2017-12-15 NOTE — ED Notes (Addendum)
See triage note  States he developed headache and fever about 2 days ago  Afebrile on arrival  Also states he found a tick on him recently  States headache is mainly back of head and neck

## 2017-12-15 NOTE — ED Triage Notes (Signed)
Pt c/o headache and fever xfew days. PT states he removed tick x2 days ago and has felt bad since. VSS, NAD noted

## 2017-12-15 NOTE — ED Provider Notes (Signed)
[facility] Emergency Department Provider Note   ____________________________________________    I have reviewed the triage vital signs and the nursing notes.   HISTORY  Chief Complaint Fever     HPI Robert Dunlap is a 27 y.o. male who presents with complaints of subjective fevers, chills, body aches/myalgias primarily in his legs.  Reports he has had a rash for nearly 1 month which comes and goes related to the heat.  Also complains of a headache over the last 2 days.  In the emergency department 2 days ago but refused work-up at that time.  Has been taking Tylenol with improvement of his symptoms.  No neurological deficits.  No nausea or vomiting.  No head injuries or trauma.  No change in vision.  Found a tick on himself this morning which she thinks may have been there for several days because it was itching in the area  History reviewed. No pertinent past medical history.  Patient Active Problem List   Diagnosis Date Noted  . Colitis 12/16/2016    History reviewed. No pertinent surgical history.  Prior to Admission medications   Medication Sig Start Date End Date Taking? Authorizing Provider  doxycycline (VIBRA-TABS) 100 MG tablet Take 1 tablet (100 mg total) by mouth 2 (two) times daily. 12/15/17   Jene Every, MD  ondansetron (ZOFRAN ODT) 4 MG disintegrating tablet Take 1 tablet (4 mg total) by mouth every 8 (eight) hours as needed for nausea or vomiting. 05/22/17   Minna Antis, MD     Allergies Hydrocodone-acetaminophen  No family history on file.  Social History Social History   Tobacco Use  . Smoking status: Current Every Day Smoker  . Smokeless tobacco: Never Used  Substance Use Topics  . Alcohol use: Yes  . Drug use: No    Review of Systems  Constitutional: As above Eyes: No visual changes.  ENT: No sore throat. Cardiovascular: Denies chest pain. Respiratory: No cough Gastrointestinal: No abdominal pain.  No  nausea, no vomiting.   Genitourinary: Negative for dysuria. Musculoskeletal: Myalgias Skin: As above. Neurological: Negative for weakness   ____________________________________________   PHYSICAL EXAM:  VITAL SIGNS: ED Triage Vitals  Enc Vitals Group     BP 12/15/17 0925 114/76     Pulse Rate 12/15/17 0925 67     Resp 12/15/17 0925 17     Temp 12/15/17 0925 98.2 F (36.8 C)     Temp src --      SpO2 12/15/17 0925 100 %     Weight 12/15/17 0926 99.8 kg (220 lb)     Height 12/15/17 0926 1.854 m (6\' 1" )     Head Circumference --      Peak Flow --      Pain Score 12/15/17 0926 4     Pain Loc --      Pain Edu? --      Excl. in GC? --     Constitutional: Alert and oriented. No acute distress.  Eyes: Conjunctivae are normal.  Head: Atraumatic Nose: No congestion/rhinnorhea. Mouth/Throat: Mucous membranes are moist.   Neck:  Painless ROM, no vertebral tenderness palpation Cardiovascular: Normal rate, regular rhythm. Grossly normal heart sounds.  Good peripheral circulation. Respiratory: Normal respiratory effort.  No retractions. Gastrointestinal: Soft and nontender. No distention.  .  Musculoskeletal: No lower extremity tenderness nor edema.  Warm and well perfused.  No joint swelling or erythema Neurologic:  Normal speech and language. No gross focal neurologic deficits are  appreciated.  Skin:  Skin is warm, dry and intact.  Scattered likely resolving rash primarily in the abdomen, maculopapular not involving the hands or soles Psychiatric: Mood and affect are normal. Speech and behavior are normal.  ____________________________________________   LABS (all labs ordered are listed, but only abnormal results are displayed)  Labs Reviewed  CBC  COMPREHENSIVE METABOLIC PANEL  CK   ____________________________________________  EKG  None ____________________________________________  RADIOLOGY  CT  head ____________________________________________   PROCEDURES  Procedure(s) performed: No  Procedures   Critical Care performed: No ____________________________________________   INITIAL IMPRESSION / ASSESSMENT AND PLAN / ED COURSE  Pertinent labs & imaging results that were available during my care of the patient were reviewed by me and considered in my medical decision making (see chart for details).  Patient here for vague symptoms, suspect viral illness ongoing for the last 3 to 4 days.  However he complains of moderate headache which he states is unusual for him and also with reports he is willing to have blood work performed today which he refused on his last visit  Will obtain basic labs including CK, CT head because he has anxiety regarding headache and is requesting imaging  Mild sinusitis on CT, given tick bite will treat with doxycycline    ____________________________________________   FINAL CLINICAL IMPRESSION(S) / ED DIAGNOSES  Final diagnoses:  Viral syndrome  Tick bite, initial encounter        Note:  This document was prepared using Dragon voice recognition software and may include unintentional dictation errors.    Jene EveryKinner, Harlee Eckroth, MD 12/15/17 1226

## 2019-05-02 ENCOUNTER — Encounter: Payer: Self-pay | Admitting: Family Medicine

## 2019-05-02 ENCOUNTER — Other Ambulatory Visit: Payer: Self-pay

## 2019-05-02 ENCOUNTER — Ambulatory Visit: Payer: Self-pay | Admitting: Family Medicine

## 2019-05-02 DIAGNOSIS — Z113 Encounter for screening for infections with a predominantly sexual mode of transmission: Secondary | ICD-10-CM

## 2019-05-02 DIAGNOSIS — B009 Herpesviral infection, unspecified: Secondary | ICD-10-CM

## 2019-05-02 LAB — GRAM STAIN

## 2019-05-02 MED ORDER — ACYCLOVIR 400 MG PO TABS: 400.0000 mg | ORAL_TABLET | Freq: Three times a day (TID) | ORAL | 0 refills | Status: AC

## 2019-05-02 NOTE — Progress Notes (Signed)
Gram stain reviewed, no treatment indicated. Patient given acyclovir per provider orders.Burt Knack, RN

## 2019-05-02 NOTE — Progress Notes (Signed)
Patient here for STD testing.Kaydra Borgen Brewer-Jensen, RN 

## 2019-05-02 NOTE — Progress Notes (Signed)
[facility] Department STI clinic/screening visit  Subjective:  Robert Dunlap is a 29 y.o. male being seen today for  Chief Complaint  Patient presents with  . Exposure to STD     The patient reports they do have symptoms.   Patient has the following medical conditions:   Patient Active Problem List   Diagnosis Date Noted  . Colitis 12/16/2016    HPI  Pt reports he noticed a blister on his penis 4-5 days ago, tingled, some burning. Also had 2 blisters on outer lips/mouth, which he hasn't had before. +Has had fatigue, soreness, grogginess, sore throat, cough x same amount of time. One of pt's partners also had a genital sore and was treated for HSV.   See flowsheet for further details and programmatic requirements.    No components found for: HCV  The following portions of the patient's history were reviewed and updated as appropriate: allergies, current medications, past medical history, past social history, past surgical history and problem list.  Objective:  There were no vitals filed for this visit.   Physical Exam Constitutional:      Appearance: Normal appearance.  HENT:     Head: Normocephalic and atraumatic.     Comments: No nits or hair loss    Mouth/Throat:     Mouth: Mucous membranes are moist.     Pharynx: Oropharynx is clear. No oropharyngeal exudate or posterior oropharyngeal erythema.   Pulmonary:     Effort: Pulmonary effort is normal.  Abdominal:     General: Abdomen is flat.     Palpations: Abdomen is soft. There is no hepatomegaly or mass.     Tenderness: There is no abdominal tenderness.  Genitourinary:    Pubic Area: No rash or pubic lice.      Penis: Circumcised. Lesions present.      Testes: Normal.     Epididymis:     Right: Normal.     Left: Normal.     Rectum: Normal.     Comments: ~.25cm well healed lesion on dorsal base of glans Lymphadenopathy:     Head:     Right side of head: No preauricular or posterior auricular  adenopathy.     Left side of head: No preauricular or posterior auricular adenopathy.     Cervical: No cervical adenopathy.     Upper Body:     Right upper body: No supraclavicular or axillary adenopathy.     Left upper body: No supraclavicular or axillary adenopathy.     Lower Body: No right inguinal adenopathy. No left inguinal adenopathy.  Skin:    General: Skin is warm and dry.     Findings: No rash.  Neurological:     Mental Status: He is alert and oriented to person, place, and time.       Assessment and Plan:  Robert Dunlap is a 29 y.o. male presenting to the [facility] Department for STI screening    1. Screening examination for venereal disease -Screenings today as below. Treat gram stain per standing order. -Patient does meet criteria for HepB, HepC Screening. Accepts these screenings. -Counseled on warning s/sx and when to seek care. Recommended condom use with all sex and discussed importance of condom use for STI prevention. - Virology, [facility] - oral (lip) - HIV/HCV [place] [facility] - HBV Antigen/Antibody [facility] - Syphilis Serology, [place] [facility] - Gonococcus culture - throat - Gonococcus culture - urethra - Gram stain  2. HSV (herpes simplex virus) infection -As genital lesion is well healed I did not attempt culture, but did culture small lesion on lip that appeared around the same time, only partially healed. As his genital symptoms of blister that was painful/burned/tingled and systemic flu like illness are very suggestive of HSV and the fact that his partner was treated today for HSV I will treat this as an initial HSV outbreak today with acyclovir.  -Pt was counseled regarding HSV disease, transmission, cyclic nature, subclinical disease and treatment options.  -Pt informed they will be called if culture is HSV + and option for suppressive treatment will also be discussed further at that time. - acyclovir (ZOVIRAX) 400 MG tablet; Take 1  tablet (400 mg total) by mouth 3 (three) times daily for 10 days.  Dispense: 30 tablet; Refill: 0   Return for screening as needed.  No future appointments.  Kandee Keen, PA-C

## 2019-05-07 LAB — GONOCOCCUS CULTURE

## 2019-05-11 LAB — HM HEPATITIS C SCREENING LAB: HM Hepatitis Screen: NEGATIVE

## 2019-05-11 LAB — HM HIV SCREENING LAB: HM HIV Screening: NEGATIVE

## 2019-05-14 ENCOUNTER — Other Ambulatory Visit: Payer: Self-pay

## 2019-05-14 ENCOUNTER — Emergency Department
Admission: EM | Admit: 2019-05-14 | Discharge: 2019-05-14 | Disposition: A | Discharge status: Home / Self Care | Payer: Medicaid Other | Attending: Emergency Medicine | Admitting: Emergency Medicine

## 2019-05-14 DIAGNOSIS — Y929 Unspecified place or not applicable: Secondary | ICD-10-CM | POA: Insufficient documentation

## 2019-05-14 DIAGNOSIS — Y9389 Activity, other specified: Secondary | ICD-10-CM | POA: Insufficient documentation

## 2019-05-14 DIAGNOSIS — T1512XA Foreign body in conjunctival sac, left eye, initial encounter: Secondary | ICD-10-CM | POA: Insufficient documentation

## 2019-05-14 DIAGNOSIS — W311XXA Contact with metalworking machines, initial encounter: Secondary | ICD-10-CM | POA: Insufficient documentation

## 2019-05-14 DIAGNOSIS — F17200 Nicotine dependence, unspecified, uncomplicated: Secondary | ICD-10-CM | POA: Insufficient documentation

## 2019-05-14 DIAGNOSIS — Y998 Other external cause status: Secondary | ICD-10-CM | POA: Insufficient documentation

## 2019-05-14 DIAGNOSIS — T1592XA Foreign body on external eye, part unspecified, left eye, initial encounter: Secondary | ICD-10-CM

## 2019-05-14 MED ORDER — TETRACAINE HCL 0.5 % OP SOLN
2.0000 [drp] | Freq: Once | OPHTHALMIC | Status: AC
  Administered 2019-05-14: 18:00:00 2 [drp] via OPHTHALMIC
  Filled 2019-05-14: qty 4

## 2019-05-14 MED ORDER — FLUORESCEIN SODIUM 1 MG OP STRP
1.0000 | ORAL_STRIP | Freq: Once | OPHTHALMIC | Status: AC
  Administered 2019-05-14: 18:00:00 1 via OPHTHALMIC
  Filled 2019-05-14: qty 1

## 2019-05-14 NOTE — ED Triage Notes (Signed)
Pt presents to ED, states has metal shaving in his L eye from a grinder, states had same thing happen 4 days ago. Pt states he is not filing as WC.

## 2019-05-14 NOTE — ED Provider Notes (Signed)
[facility] Emergency Department Provider Note  ____________________________________________  Time seen: Approximately 5:31 PM  I have reviewed the triage vital signs and the nursing notes.   HISTORY  Chief Complaint Foreign Body in Eye    HPI Robert Dunlap is a 29 y.o. male who presents the emergency department for evaluation for possible foreign body to left eye.  Patient was using welder, believes that metal from a grinder is in his left eye.  He had a similar episode 4 days ago and had ophthalmology remove a piece of metal from his right eye.  Patient states that he wears glasses, but in both instances the piece of metal bounced off of his face, his glass and then entered his eye.  Patient denies any vision changes.  He is having foreign body sensation to the left eye.  Patient does not wear glasses or contacts.  No other complaints at this time.       History reviewed. No pertinent past medical history.  Patient Active Problem List   Diagnosis Date Noted  . Colitis 12/16/2016    History reviewed. No pertinent surgical history.  Prior to Admission medications   Not on File    Allergies Hydrocodone-acetaminophen  History reviewed. No pertinent family history.  Social History Social History   Tobacco Use  . Smoking status: Current Every Day Smoker  . Smokeless tobacco: Never Used  Substance Use Topics  . Alcohol use: Yes  . Drug use: No     Review of Systems  Constitutional: No fever/chills Eyes: No visual changes. No discharge ENT: Possible metallic foreign body to left eye Cardiovascular: no chest pain. Respiratory: no cough. No SOB. Gastrointestinal: No abdominal pain.  No nausea, no vomiting.   Musculoskeletal: Negative for musculoskeletal pain. Skin: Negative for rash, abrasions, lacerations, ecchymosis. Neurological: Negative for headaches, focal weakness or numbness. 10-point ROS otherwise  negative.  ____________________________________________   PHYSICAL EXAM:  VITAL SIGNS: ED Triage Vitals  Enc Vitals Group     BP 05/14/19 1659 123/72     Pulse Rate 05/14/19 1659 64     Resp 05/14/19 1659 18     Temp 05/14/19 1659 98.3 F (36.8 C)     Temp Source 05/14/19 1659 Oral     SpO2 05/14/19 1659 100 %     Weight 05/14/19 1656 210 lb (95.3 kg)     Height 05/14/19 1656 6\' 1"  (1.854 m)     Head Circumference --      Peak Flow --      Pain Score 05/14/19 1656 5     Pain Loc --      Pain Edu? --      Excl. in [place]? --      Constitutional: Alert and oriented. Well appearing and in no acute distress. Eyes: Conjunctivae are normal. PERRL. EOMI. funduscopic exam reveals foreign body in the 5 o'clock position over the iris.  No other visible abnormality of the external eye.  Red reflex present.  No evidence of hyphema.  Vasculature and optic disc is unremarkable.  Eye is anesthetized using tetracaine.  Fluorescein staining applied.  Other than foreign body, no other areas of uptake. Head: Atraumatic. ENT:      Ears:       Nose: No congestion/rhinnorhea.      Mouth/Throat: Mucous membranes are moist.  Neck: No stridor.    Cardiovascular: Normal rate, regular rhythm. Normal S1 and S2.  Good peripheral circulation. Respiratory: Normal respiratory effort without tachypnea  or retractions. Lungs CTAB. Good air entry to the bases with no decreased or absent breath sounds. Musculoskeletal: Full range of motion to all extremities. No gross deformities appreciated. Neurologic:  Normal speech and language. No gross focal neurologic deficits are appreciated.  Skin:  Skin is warm, dry and intact. No rash noted. Psychiatric: Mood and affect are normal. Speech and behavior are normal. Patient exhibits appropriate insight and judgement.   ____________________________________________   LABS (all labs ordered are listed, but only abnormal results are displayed)  Labs Reviewed - No data to  display ____________________________________________  EKG   ____________________________________________  RADIOLOGY   No results found.  ____________________________________________    PROCEDURES  Procedure(s) performed:    .Foreign Body Removal  Date/Time: 05/14/2019 11:50 PM Performed by: Racheal Patches, PA-C Authorized by: Racheal Patches, PA-C  Consent: Verbal consent obtained. Risks and benefits: risks, benefits and alternatives were discussed Consent given by: patient Patient understanding: patient states understanding of the procedure being performed Patient identity confirmed: verbally with patient Time out: Immediately prior to procedure a "time out" was called to verify the correct patient, procedure, equipment, support staff and site/side marked as required. Body area: eye Location details: left conjunctiva  Anesthesia: Local Anesthetic: tetracaine drops  Sedation: Patient sedated: no  Patient restrained: no Patient cooperative: yes Localization method: visualized Removal mechanism: 27-gauge needle (Alger brush) Eye examined with fluorescein. Fluorescein uptake. Corneal abrasion size: small Corneal abrasion location: lateral and inferior Residual rust ring present. Residual rust ring removed. Dressing: antibiotic drops Depth: superficial Complexity: simple 1 objects recovered. Objects recovered: metal shaving Post-procedure assessment: foreign body removed Patient tolerance: patient tolerated the procedure well with no immediate complications      Medications  fluorescein ophthalmic strip 1 strip (1 strip Left Eye Given by Other 05/14/19 1750)  tetracaine (PONTOCAINE) 0.5 % ophthalmic solution 2 drop (2 drops Left Eye Given 05/14/19 1750)     ____________________________________________   INITIAL IMPRESSION / ASSESSMENT AND PLAN / ED COURSE  Pertinent labs & imaging results that were available during my care of the  patient were reviewed by me and considered in my medical decision making (see chart for details).  Review of the [place] CSRS was performed in accordance of the [facility] prior to dispensing any controlled drugs.           Patient's diagnosis is consistent with foreign body to the left eye.  Patient presented to the emergency department with a foreign body in the left eye.  This occurred while using a grinder while welding.  Metal shaving is removed.  Using Alger brush, mild rust ring removed as well.  Patient will be on an antibiotic eyedrops already prescribed by ophthalmology for right eye after metal was removed 4 days ago..  Follow-up with ophthalmology..  Patient is given ED precautions to return to the ED for any worsening or new symptoms.     ____________________________________________  FINAL CLINICAL IMPRESSION(S) / ED DIAGNOSES  Final diagnoses:  Foreign body of left eye, initial encounter      NEW MEDICATIONS STARTED DURING THIS VISIT:  ED Discharge Orders    None          This chart was dictated using voice recognition software/Dragon. Despite best efforts to proofread, errors can occur which can change the meaning. Any change was purely unintentional.    Racheal Patches, PA-C 05/14/19 2352    Dionne Bucy, MD 05/17/19 [phone]

## 2019-05-14 NOTE — ED Notes (Signed)
See triage note  Presents with possible f/b in left eye    States possible metal shaving from grinder

## 2019-10-30 ENCOUNTER — Encounter: Payer: Self-pay | Admitting: Physician Assistant

## 2019-10-30 ENCOUNTER — Ambulatory Visit: Payer: Self-pay | Admitting: Physician Assistant

## 2019-10-30 ENCOUNTER — Other Ambulatory Visit: Payer: Self-pay

## 2019-10-30 DIAGNOSIS — Z113 Encounter for screening for infections with a predominantly sexual mode of transmission: Secondary | ICD-10-CM

## 2019-10-30 DIAGNOSIS — N341 Nonspecific urethritis: Secondary | ICD-10-CM

## 2019-10-30 LAB — GRAM STAIN

## 2019-10-30 MED ORDER — AZITHROMYCIN 500 MG PO TABS
1000.0000 mg | ORAL_TABLET | Freq: Once | ORAL | Status: AC
  Administered 2019-10-30: 1000 mg via ORAL

## 2019-10-30 NOTE — Progress Notes (Signed)
[facility] Department STI clinic/screening visit t Subjective:  Robert Dunlap is a 29 y.o. male being seen today for an STI screening visit. The patient reports they do have symptoms.    Patient has the following medical conditions:   Patient Active Problem List   Diagnosis Date Noted  . Colitis 12/16/2016     Chief Complaint  Patient presents with  . SEXUALLY TRANSMITTED DISEASE    screening    HPI  Patient reports that he has had tenderness at the head of his penis, dysuria and itching and has "felt" discharge but not seen any except that his penis was stuck to his underwear for about 5 days.  Denies chronic conditions, surgeries and regular medicines.  States last HIV test was 6-7 months ago.  Per patient, last void prior to sample collection for Gram stain was about 2 hr ago.   See flowsheet for further details and programmatic requirements.    The following portions of the patient's history were reviewed and updated as appropriate: allergies, current medications, past medical history, past social history, past surgical history and problem list.  Objective:  There were no vitals filed for this visit.  Physical Exam Constitutional:      General: He is not in acute distress.    Appearance: Normal appearance.  HENT:     Head: Normocephalic and atraumatic.     Comments: No nits, lice, or hair loss. No cervical, supraclavicular or axillary adenopathy.     Mouth/Throat:     Mouth: Mucous membranes are moist.     Pharynx: Oropharynx is clear. No oropharyngeal exudate or posterior oropharyngeal erythema.  Eyes:     Conjunctiva/sclera: Conjunctivae normal.  Pulmonary:     Effort: Pulmonary effort is normal.  Abdominal:     Palpations: Abdomen is soft. There is no mass.     Tenderness: There is no abdominal tenderness. There is no guarding or rebound.  Genitourinary:    Penis: Normal.      Testes: Normal.     Comments: Pubic area without nits, lice,  edema, erythema, lesions and inguinal adenopathy. Penis circumcised, without rash or lesions. Urethral meatus with mild edema, moderate erythema but no visible discharge on exam.  Musculoskeletal:     Cervical back: Neck supple. No tenderness.  Skin:    General: Skin is warm and dry.     Findings: No bruising, erythema, lesion or rash.  Neurological:     Mental Status: He is alert and oriented to person, place, and time.  Psychiatric:        Mood and Affect: Mood normal.        Behavior: Behavior normal.        Thought Content: Thought content normal.        Judgment: Judgment normal.       Assessment and Plan:  Robert Dunlap is a 29 y.o. male presenting to the [facility] Department for STI screening  1. Screening for STD (sexually transmitted disease) Patient into clinic with symptoms. Rec condoms with all sex. Await test results.  Counseled that RN will call if needs to RTC for treatment once results are back. - Gram stain - Gonococcus culture - HIV/HCV [place] - Syphilis Serology, [place] [facility] - Gonococcus culture  2. NGU (nongonococcal urethritis) Due to patient symptoms, risk and exam findings, will treat for NGU with Azithromycin 1g po DOT today. No sex for 7 days and until after partner/s complete treatment.  RTC for re-treatment if vomits < 2 hr after completing medicine. Counseled to RTC if symptoms do not resolve after 10 days for discussion of alternative treatment. - azithromycin (ZITHROMAX) tablet 1,000 mg     Return in about 3 weeks (around 11/20/2019) for test results..  No future appointments.  Matt Holmes, PA

## 2019-11-04 LAB — GONOCOCCUS CULTURE

## 2019-11-06 ENCOUNTER — Encounter: Payer: Self-pay | Admitting: Family Medicine

## 2019-12-09 ENCOUNTER — Encounter: Payer: Self-pay | Admitting: Emergency Medicine

## 2019-12-09 ENCOUNTER — Other Ambulatory Visit: Payer: Self-pay

## 2019-12-09 ENCOUNTER — Emergency Department
Admission: EM | Admit: 2019-12-09 | Discharge: 2019-12-09 | Disposition: A | Discharge status: Home / Self Care | Payer: Medicaid Other | Attending: Emergency Medicine | Admitting: Emergency Medicine

## 2019-12-09 DIAGNOSIS — F17210 Nicotine dependence, cigarettes, uncomplicated: Secondary | ICD-10-CM | POA: Insufficient documentation

## 2019-12-09 DIAGNOSIS — J029 Acute pharyngitis, unspecified: Secondary | ICD-10-CM | POA: Insufficient documentation

## 2019-12-09 DIAGNOSIS — J039 Acute tonsillitis, unspecified: Secondary | ICD-10-CM

## 2019-12-09 LAB — CBC WITH DIFFERENTIAL/PLATELET
Abs Immature Granulocytes: 0.08 10*3/uL — ABNORMAL HIGH (ref 0.00–0.07)
Basophils Absolute: 0.1 10*3/uL (ref 0.0–0.1)
Basophils Relative: 0 %
Eosinophils Absolute: 0.1 10*3/uL (ref 0.0–0.5)
Eosinophils Relative: 1 %
HCT: 43.4 % (ref 39.0–52.0)
Hemoglobin: 14.8 g/dL (ref 13.0–17.0)
Immature Granulocytes: 1 %
Lymphocytes Relative: 9 %
Lymphs Abs: 1.3 10*3/uL (ref 0.7–4.0)
MCH: 30.5 pg (ref 26.0–34.0)
MCHC: 34.1 g/dL (ref 30.0–36.0)
MCV: 89.5 fL (ref 80.0–100.0)
Monocytes Absolute: 1.4 10*3/uL — ABNORMAL HIGH (ref 0.1–1.0)
Monocytes Relative: 10 %
Neutro Abs: 11.8 10*3/uL — ABNORMAL HIGH (ref 1.7–7.7)
Neutrophils Relative %: 79 %
Platelets: 188 10*3/uL (ref 150–400)
RBC: 4.85 MIL/uL (ref 4.22–5.81)
RDW: 12.6 % (ref 11.5–15.5)
WBC: 14.7 10*3/uL — ABNORMAL HIGH (ref 4.0–10.5)
nRBC: 0 % (ref 0.0–0.2)

## 2019-12-09 LAB — GROUP A STREP BY PCR: Group A Strep by PCR: NOT DETECTED

## 2019-12-09 LAB — MONONUCLEOSIS SCREEN: Mono Screen: NEGATIVE

## 2019-12-09 MED ORDER — AMOXICILLIN 875 MG PO TABS: 875.0000 mg | ORAL_TABLET | Freq: Two times a day (BID) | ORAL | 0 refills | Status: AC

## 2019-12-09 MED ORDER — ACETAMINOPHEN 500 MG PO TABS
1000.0000 mg | ORAL_TABLET | Freq: Once | ORAL | Status: AC
  Administered 2019-12-09: 1000 mg via ORAL
  Filled 2019-12-09: qty 2

## 2019-12-09 MED ORDER — MAGIC MOUTHWASH: ORAL | 0 refills | Status: AC

## 2019-12-09 MED ORDER — LIDOCAINE VISCOUS HCL 2 % MT SOLN
15.0000 mL | Freq: Once | OROMUCOSAL | Status: AC
  Administered 2019-12-09: 15 mL via OROMUCOSAL
  Filled 2019-12-09: qty 15

## 2019-12-09 MED ORDER — MAGIC MOUTHWASH: ORAL | 0 refills | Status: DC

## 2019-12-09 NOTE — Discharge Instructions (Signed)
Follow-up with your primary care provider or [facility] acute care if any continued problems.  You may return to the emergency department if any severe worsening of your symptoms or difficulty swallowing.  There is a medication that was sent to your pharmacy for throat pain.  This medication may cause some numbness in your mouth due to the lidocaine.  Follow directions.  Also amoxicillin 875 was sent to your pharmacy to take twice a day for the next 10 days.  You may take Tylenol or ibuprofen as needed for throat pain, body aches or fever.

## 2019-12-09 NOTE — ED Provider Notes (Signed)
[facility] Emergency Department Provider Note   ____________________________________________   First MD Initiated Contact with Patient 12/09/19 1324     (approximate)  I have reviewed the triage vital signs and the nursing notes.   HISTORY  Chief Complaint Sore Throat   HPI Robert Dunlap is a 29 y.o. male presents to the ED with complaint of sore throat for the last 2 days.  Patient states that he has difficulty eating because of the pain.  He is unaware of any fever and denies chills.  He denies any change in taste or smell.  Negative for cough or known Covid exposure.  He rates pain as a 10/10.      History reviewed. No pertinent past medical history.  Patient Active Problem List   Diagnosis Date Noted  . Colitis 12/16/2016    History reviewed. No pertinent surgical history.  Prior to Admission medications   Medication Sig Start Date End Date Taking? Authorizing Provider  amoxicillin (AMOXIL) 875 MG tablet Take 1 tablet (875 mg total) by mouth 2 (two) times daily. 12/09/19   Tommi Rumps, PA-C  magic mouthwash SOLN Equal parts diphenhydramine, Maalox and viscous lidocaine.  5 to 10 mL swish and spit 4 times daily as needed throat pain. 12/09/19   Tommi Rumps, PA-C    Allergies Hydrocodone-acetaminophen  No family history on file.  Social History Social History   Tobacco Use  . Smoking status: Current Every Day Smoker    Types: Cigarettes  . Smokeless tobacco: Never Used  Substance Use Topics  . Alcohol use: Yes  . Drug use: Not Currently    Types: Marijuana    Review of Systems Constitutional: No fever/chills Eyes: No visual changes. ENT: Positive sore throat. Cardiovascular: Denies chest pain. Respiratory: Denies shortness of breath.  Negative for cough. Gastrointestinal: No abdominal pain.  No nausea, no vomiting. Musculoskeletal: Negative for muscle skeletal pain. Skin: Negative for rash. Neurological: Negative  for headaches, focal weakness or numbness. ____________________________________________   PHYSICAL EXAM:  VITAL SIGNS: ED Triage Vitals  Enc Vitals Group     BP 12/09/19 1225 115/71     Pulse Rate 12/09/19 1225 98     Resp 12/09/19 1225 16     Temp 12/09/19 1225 98.9 F (37.2 C)     Temp Source 12/09/19 1225 Oral     SpO2 12/09/19 1225 97 %     Weight 12/09/19 1226 195 lb (88.5 kg)     Height 12/09/19 1226 6' (1.829 m)     Head Circumference --      Peak Flow --      Pain Score 12/09/19 1226 10     Pain Loc --      Pain Edu? --      Excl. in [place]? --     Constitutional: Alert and oriented. Well appearing and in no acute distress. Eyes: Conjunctivae are normal. PERRL. EOMI. Head: Atraumatic. Nose: No congestion/rhinnorhea. Mouth/Throat: Mucous membranes are moist.  Oropharynx mildly erythematous with exudate bilaterally.  Uvula is midline.  Patient is able to swallow secretions without any difficulty. Neck: No stridor.   Hematological/Lymphatic/Immunilogical: Mild bilateral cervical lymphadenopathy. Cardiovascular: Normal rate, regular rhythm. Grossly normal heart sounds.  Good peripheral circulation. Respiratory: Normal respiratory effort.  No retractions. Lungs CTAB. Musculoskeletal: Moves upper and lower extremities no difficulty.  Normal gait was noted. Neurologic:  Normal speech and language. No gross focal neurologic deficits are appreciated. No gait instability. Skin:  Skin is  warm, dry and intact. No rash noted. Psychiatric: Mood and affect are normal. Speech and behavior are normal.  ____________________________________________   LABS (all labs ordered are listed, but only abnormal results are displayed)  Labs Reviewed  CBC WITH DIFFERENTIAL/PLATELET - Abnormal; Notable for the following components:      Result Value   WBC 14.7 (*)    Neutro Abs 11.8 (*)    Monocytes Absolute 1.4 (*)    Abs Immature Granulocytes 0.08 (*)    All other components within normal  limits  GROUP A STREP BY PCR  MONONUCLEOSIS SCREEN    PROCEDURES  Procedure(s) performed (including Critical Care):  Procedures   ____________________________________________   INITIAL IMPRESSION / ASSESSMENT AND PLAN / ED COURSE  As part of my medical decision making, I reviewed the following data within the electronic MEDICAL RECORD NUMBER Notes from prior ED visits and [place] Controlled Substance Database  ATWOOD ADCOCK was evaluated in Emergency Department on 12/09/2019 for the symptoms described in the history of present illness. He was evaluated in the context of the global COVID-19 pandemic, which necessitated consideration that the patient might be at risk for infection with the SARS-CoV-2 virus that causes COVID-19. Institutional protocols and algorithms that pertain to the evaluation of patients at risk for COVID-19 are in a state of rapid change based on information released by regulatory bodies including the CDC and federal and state organizations. These policies and algorithms were followed during the patient's care in the ED.  29 year old male presents to the ED with complaint of sore throat that began 2 days ago.  Patient states that he is able to swallow without any difficulty but has not eaten due to throat pain.  He reports some diarrhea and subjective fever.  Denies any difficulty breathing and is unaware of any change in taste or smell.  He denies any known exposure to Covid.  On exam tonsils are mildly inflamed with exudate bilaterally.  Uvula is midline.  Mono, CBC and strep test were negative.  Patient was made aware.  A prescription for amoxicillin 875 and Magic mouthwash was sent to his pharmacy.  He will continue drinking fluids and take Tylenol or ibuprofen as needed.  Also he is encouraged to drink fluids to stay hydrated even if he does not feel like eating.  He is to follow-up with his PCP or [facility] acute care if any continued  problems. ____________________________________________   FINAL CLINICAL IMPRESSION(S) / ED DIAGNOSES  Final diagnoses:  Exudative tonsillitis     ED Discharge Orders         Ordered    amoxicillin (AMOXIL) 875 MG tablet  2 times daily     Discontinue  Reprint     12/09/19 1526    magic mouthwash SOLN  Status:  Discontinued     Reprint     12/09/19 1532    magic mouthwash SOLN     Discontinue  Reprint     12/09/19 1539           Note:  This document was prepared using Dragon voice recognition software and may include unintentional dictation errors.    Tommi Rumps, PA-C 12/09/19 1544    Arnaldo Natal, MD 12/09/19 1743

## 2019-12-09 NOTE — ED Notes (Signed)
Pt c/o not eating for 3 days; diarrhea; fever; migraine; difficulty swallowing. Denies difficulty breathing. Resp reg/unlabored.

## 2019-12-09 NOTE — ED Triage Notes (Signed)
Pt to ED via POV c/o Sore throat since Friday night. Pt states that she is unable to eat because of the pain. Pt has had fever at night but is not running fever right now. Pt is in NAD.

## 2020-05-24 ENCOUNTER — Emergency Department: Payer: Medicaid Other

## 2020-05-24 ENCOUNTER — Other Ambulatory Visit: Payer: Self-pay

## 2020-05-24 ENCOUNTER — Encounter: Payer: Self-pay | Admitting: Emergency Medicine

## 2020-05-24 DIAGNOSIS — H02846 Edema of left eye, unspecified eyelid: Secondary | ICD-10-CM | POA: Insufficient documentation

## 2020-05-24 DIAGNOSIS — Z5321 Procedure and treatment not carried out due to patient leaving prior to being seen by health care provider: Secondary | ICD-10-CM | POA: Insufficient documentation

## 2020-05-24 DIAGNOSIS — Y92410 Unspecified street and highway as the place of occurrence of the external cause: Secondary | ICD-10-CM | POA: Insufficient documentation

## 2020-05-24 NOTE — ED Notes (Signed)
Patient transported to CT 
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
## 2020-05-25 ENCOUNTER — Emergency Department
Admission: EM | Admit: 2020-05-25 | Discharge: 2020-05-25 | Disposition: A | Discharge status: Home / Self Care | Payer: Medicaid Other | Attending: Emergency Medicine | Admitting: Emergency Medicine

## 2020-05-25 NOTE — ED Notes (Signed)
Patient called for vital signs check with no answer 

## 2020-05-25 NOTE — ED Notes (Signed)
Patient called for vital signs with no answer. 

## 2020-07-15 ENCOUNTER — Emergency Department
Admission: EM | Admit: 2020-07-15 | Discharge: 2020-07-15 | Disposition: A | Discharge status: Home / Self Care | Payer: Medicaid Other | Attending: Emergency Medicine | Admitting: Emergency Medicine

## 2020-07-15 ENCOUNTER — Other Ambulatory Visit: Payer: Self-pay

## 2020-07-15 DIAGNOSIS — R45851 Suicidal ideations: Secondary | ICD-10-CM

## 2020-07-15 DIAGNOSIS — F32A Depression, unspecified: Secondary | ICD-10-CM | POA: Insufficient documentation

## 2020-07-15 DIAGNOSIS — F1994 Other psychoactive substance use, unspecified with psychoactive substance-induced mood disorder: Secondary | ICD-10-CM

## 2020-07-15 DIAGNOSIS — F1110 Opioid abuse, uncomplicated: Secondary | ICD-10-CM

## 2020-07-15 DIAGNOSIS — F17210 Nicotine dependence, cigarettes, uncomplicated: Secondary | ICD-10-CM | POA: Insufficient documentation

## 2020-07-15 DIAGNOSIS — Z20822 Contact with and (suspected) exposure to covid-19: Secondary | ICD-10-CM | POA: Insufficient documentation

## 2020-07-15 LAB — COMPREHENSIVE METABOLIC PANEL
ALT: 23 U/L (ref 0–44)
AST: 19 U/L (ref 15–41)
Albumin: 4.8 g/dL (ref 3.5–5.0)
Alkaline Phosphatase: 63 U/L (ref 38–126)
Anion gap: 7 (ref 5–15)
BUN: 11 mg/dL (ref 6–20)
CO2: 28 mmol/L (ref 22–32)
Calcium: 9.6 mg/dL (ref 8.9–10.3)
Chloride: 104 mmol/L (ref 98–111)
Creatinine, Ser: 0.85 mg/dL (ref 0.61–1.24)
GFR, Estimated: >60 mL/min (ref >60)
Glucose, Bld: 116 mg/dL — ABNORMAL HIGH (ref 70–99)
Potassium: 3.7 mmol/L (ref 3.5–5.1)
Sodium: 139 mmol/L (ref 135–145)
Total Bilirubin: 1 mg/dL (ref 0.3–1.2)
Total Protein: 8.1 g/dL (ref 6.5–8.1)

## 2020-07-15 LAB — CBC
HCT: 48.8 % (ref 39.0–52.0)
Hemoglobin: 16.2 g/dL (ref 13.0–17.0)
MCH: 29.9 pg (ref 26.0–34.0)
MCHC: 33.2 g/dL (ref 30.0–36.0)
MCV: 90.2 fL (ref 80.0–100.0)
Platelets: 296 10*3/uL (ref 150–400)
RBC: 5.41 MIL/uL (ref 4.22–5.81)
RDW: 13 % (ref 11.5–15.5)
WBC: 6.7 10*3/uL (ref 4.0–10.5)
nRBC: 0 % (ref 0.0–0.2)

## 2020-07-15 LAB — RESP PANEL BY RT-PCR (FLU A&B, COVID) ARPGX2
Influenza A by PCR: NEGATIVE
Influenza B by PCR: NEGATIVE
SARS Coronavirus 2 by RT PCR: NEGATIVE

## 2020-07-15 LAB — SALICYLATE LEVEL: Salicylate Lvl: <7 mg/dL — ABNORMAL LOW (ref 7.0–30.0)

## 2020-07-15 LAB — ETHANOL: Alcohol, Ethyl (B): <10 mg/dL (ref <10)

## 2020-07-15 LAB — ACETAMINOPHEN LEVEL: Acetaminophen (Tylenol), Serum: <10 ug/mL — ABNORMAL LOW (ref 10–30)
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
## 2020-07-15 NOTE — ED Notes (Signed)
Pt dressed out in hospital scrubs. Pt's belongings to include: 1 black shirt 1 pair of jeans 2 brown boots 1 black hat 2 black socks 1 gray underwear    Pt gave belt and wallet to girlfriend before coming back to triage.

## 2020-07-15 NOTE — ED Triage Notes (Addendum)
Pt comes with c/o SI. Pt states he is going to kill himself. Pt states he is so depressed. Pt states he has lost his kids, home and job.  Pt admits to using fent and Perocets everyday.  Pt states last used yesterday. Pt also states he takes xanax to help him calm down. Pt denies any alcohol use. Pt denies any prescribed medications.  Pt tearful in triage.  Pt denies any plan at this time.

## 2020-07-15 NOTE — ED Notes (Signed)
Pt dressing for discharge.  

## 2020-07-15 NOTE — ED Notes (Signed)
Breakfast tray was given with juice. 

## 2020-07-15 NOTE — ED Notes (Signed)
Dr. Clapacs at beside.  

## 2020-07-15 NOTE — BH Assessment (Addendum)
Comprehensive Clinical Assessment (CCA) Screening, Triage and Referral Note  07/15/2020 Robert Dunlap [id number]   Robert Dunlap is an 30 y.o male who presents to [facility] ED voluntarily for treatment. Per triage note, Pt comes with c/o SI. Pt states he is going to kill himself. Pt states he is so depressed. Pt states he has lost his kids, home and job. Pt admits to using fent and Percocet's every day.  Pt states last used yesterday. Pt also states he takes Xanax to help him calm down. Pt denies any alcohol use. Pt denies any prescribed medications. Pt tearful in triage. Pt denies any plan at this time.  During TTS assessment pt presents calm, labile, tearful, oriented x 3, depressed, anxious, restless but cooperative, and mood-congruent with affect. The pt does not appear to be responding to internal or external stimuli. Neither is the pt presenting with any delusional thinking. Pt was able to verify the information provided to triage RN. Pt states, "I'm just wigging out". Pt identified his main compliant to be depression, fleeting SI without intent or a plan and SA (Narcotics & Benzodiazepines). Pt reports his last use of narcotics and benzodiazepines to be 2 days ago and to be currently going through withdrawals (body pains, stomach pains, diarrhea). Pt identified endorsing the following symptoms since October 202, restlessness, worsening depression/anxiety, lack of sleep, lack of appetite, anger, guilt and feeling overwhelmed. Pt identified the following triggers to his current symptoms; the loss of his home in October, restlessness, homelessness, and increased stressors (finances & personal). Pt denies the use of any other substances or hx of MH,  INPT and a  family hx of MH. Pt reports a previous hx of OPT with [facility] for SA but expressed to dislike the agency. Pt expressed to need for medications for depression and anxiety but was reluctant and eventually refused to seek INPT. Pt expressed preference to  follow up with OPT resources independently and was provided the appropriate facilities. Pt denies any current SI/HI/AH/VH and contracts for safety  Per Dr. Toni Amend pt is recommended to follow up with resources provided  Chief Complaint:  Chief Complaint  Patient presents with  . SI  . Depression  . Addiction Problem   Visit Diagnosis: MDD        Patient Reported Information How did you hear about [place]? Self   Referral name: Self   Referral phone number: No data recorded Whom do you see for routine medical problems? I don't have a doctor   Practice/Facility Name: No data recorded  Practice/Facility Phone Number: No data recorded  Name of Contact: No data recorded  Contact Number: No data recorded  Contact Fax Number: No data recorded  Prescriber Name: No data recorded  Prescriber Address (if known): No data recorded What Is the Reason for Your Visit/Call Today? Depression, SA & SI  How Long Has This Been Causing You Problems? 1-6 months  Have You Recently Been in Any Inpatient Treatment (Hospital/Detox/Crisis Center/28-Day Program)? No   Name/Location of Program/Hospital:No data recorded  How Long Were You There? No data recorded  When Were You Discharged? No data recorded Have You Ever Received Services From [facility] Before? No   Who Do You See at [facility]? No data recorded Have You Recently Had Any Thoughts About Hurting Yourself? Yes   Are You Planning to Commit Suicide/Harm Yourself At This time?  No  Have you Recently Had Thoughts About Hurting Someone Karolee Ohs? No   Explanation: No data recorded Have  You Used Any Alcohol or Drugs in the Past 24 Hours? Yes   How Long Ago Did You Use Drugs or Alcohol?  No data recorded  What Did You Use and How Much? Narcotics & Benzodiazepines  What Do You Feel Would Help You the Most Today? Treatment for Depression or other mood problem; Housing Assistance; Stress Management; Medication(s); Alcohol or Drug Use Treatment  Do  You Currently Have a Therapist/Psychiatrist? No   Name of Therapist/Psychiatrist: No data recorded  Have You Been Recently Discharged From Any Office Practice or Programs? No   Explanation of Discharge From Practice/Program:  No data recorded    CCA Screening Triage Referral Assessment Type of Contact: Face-to-Face   Is this Initial or Reassessment? No data recorded  Date Telepsych consult ordered in [facility]:  No data recorded  Time Telepsych consult ordered in [facility]:  No data recorded Patient Reported Information Reviewed? Yes   Patient Left Without Being Seen? No data recorded  Reason for Not Completing Assessment: No data recorded Collateral Involvement: None provided  Does Patient Have a Court Appointed Legal Guardian? No data recorded  Name and Contact of Legal Guardian:  No data recorded If Minor and Not Living with Parent(s), Who has Custody? n/a  Is CPS involved or ever been involved? Currently  Is APS involved or ever been involved? Never  Patient Determined To Be At Risk for Harm To Self or Others Based on Review of Patient Reported Information or Presenting Complaint? No   Method: No data recorded  Availability of Means: No data recorded  Intent: No data recorded  Notification Required: No data recorded  Additional Information for Danger to Others Potential:  No data recorded  Additional Comments for Danger to Others Potential:  No data recorded  Are There Guns or Other Weapons in Your Home?  No data recorded   Types of Guns/Weapons: No data recorded   Are These Weapons Safely Secured?                              No data recorded   Who Could Verify You Are Able To Have These Secured:    No data recorded Do You Have any Outstanding Charges, Pending Court Dates, Parole/Probation? No data recorded Contacted To Inform of Risk of Harm To Self or Others: No data recorded Location of Assessment: [facility] ED  Does Patient Present under Involuntary Commitment? No   IVC Papers  Initial File Date: No data recorded  [place] of Residence: [place]  Patient Currently Receiving the Following Services: Not Receiving Services   Determination of Need: Emergent (2 hours)   Options For Referral: Medication Management; Intensive Outpatient Therapy   Opal Sidles, LCSWA

## 2020-07-15 NOTE — ED Provider Notes (Signed)
[facility] Emergency Department Provider Note   ____________________________________________   Event Date/Time   First MD Initiated Contact with Patient 07/15/20 [phone]     (approximate)  I have reviewed the triage vital signs and the nursing notes.   HISTORY  Chief Complaint SI, Depression, and Addiction Problem    HPI Robert Dunlap is a 30 y.o. 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  Patient denies any history of depression.  Patient states that recently he has "lost his kids, home, and job" and this is the reason for his worsening depression.  Patient states that he uses oxycodone as well as benzodiazepines whenever he is "dope sick".  Patient currently denies any homicidal ideation, auditory/visual hallucinations, vision changes, tinnitus, difficulty speaking, facial droop, sore throat, chest pain, shortness of breath, abdominal pain, nausea/vomiting/diarrhea, dysuria, or weakness/numbness/paresthesias in any extremity         No past medical history on file.  Patient Active Problem List   Diagnosis Date Noted  . Colitis 12/16/2016    No past surgical history on file.  Prior to Admission medications   Medication Sig Start Date End Date Taking? Authorizing Provider  amoxicillin (AMOXIL) 875 MG tablet Take 1 tablet (875 mg total) by mouth 2 (two) times daily. 12/09/19   Tommi Rumps, PA-C  magic mouthwash SOLN Equal parts diphenhydramine, Maalox and viscous lidocaine.  5 to 10 mL swish and spit 4 times daily as needed throat pain. 12/09/19   Tommi Rumps, PA-C    Allergies Hydrocodone-acetaminophen  No family history on file.  Social History Social History   Tobacco Use  . Smoking status: Current Every Day Smoker    Types: Cigarettes  . Smokeless tobacco: Never Used  Substance Use Topics  . Alcohol use: Not Currently  . Drug use: Yes     Types: Marijuana    Comment: fent perocets last night    Review of Systems Constitutional: No fever/chills Eyes: No visual changes. ENT: No sore throat. Cardiovascular: Denies chest pain. Respiratory: Denies shortness of breath. Gastrointestinal: No abdominal pain.  No nausea, no vomiting.  No diarrhea. Genitourinary: Negative for dysuria. Musculoskeletal: Negative for acute arthralgias Skin: Negative for rash. Neurological: Negative for headaches, weakness/numbness/paresthesias in any extremity Psychiatric: Negative for homicidal ideation   ____________________________________________   PHYSICAL EXAM:  VITAL SIGNS: ED Triage Vitals  Enc Vitals Group     BP 07/15/20 0725 125/71     Pulse Rate 07/15/20 0725 62     Resp 07/15/20 0725 18     Temp 07/15/20 0725 98 F (36.7 C)     Temp src --      SpO2 07/15/20 0725 100 %     Weight 07/15/20 0729 180 lb (81.6 kg)     Height 07/15/20 0729 6' (1.829 m)     Head Circumference --      Peak Flow --      Pain Score 07/15/20 0729 0     Pain Loc --      Pain Edu? --      Excl. in GC? --    Constitutional: Alert and oriented. Well appearing and in no acute distress. Eyes: Conjunctivae are normal. PERRL. Head: Atraumatic. Nose: No congestion/rhinnorhea. Mouth/Throat: Mucous membranes are moist. Neck: No stridor Cardiovascular: Grossly normal heart sounds.  Good peripheral circulation. Respiratory: Normal respiratory effort.  No retractions. Gastrointestinal: Soft and nontender. No distention. Musculoskeletal:  No obvious deformities Neurologic:  Normal speech and language. No gross focal neurologic deficits are appreciated. Skin:  Skin is warm and dry. No rash noted. Psychiatric: Mood is depressed and affect is flat. Speech and behavior are normal.  ____________________________________________   LABS (all labs ordered are listed, but only abnormal results are displayed)  Labs Reviewed  COMPREHENSIVE METABOLIC PANEL -  Abnormal; Notable for the following components:      Result Value   Glucose, Bld 116 (*)    All other components within normal limits  SALICYLATE LEVEL - Abnormal; Notable for the following components:   Salicylate Lvl <7.0 (*)    All other components within normal limits  ACETAMINOPHEN LEVEL - Abnormal; Notable for the following components:   Acetaminophen (Tylenol), Serum <10 (*)    All other components within normal limits  RESP PANEL BY RT-PCR (FLU A&B, COVID) ARPGX2  ETHANOL  CBC  URINE DRUG SCREEN, QUALITATIVE ([facility] ONLY)   PROCEDURES  Procedure(s) performed (including Critical Care):  Procedures   ____________________________________________   INITIAL IMPRESSION / ASSESSMENT AND PLAN / ED COURSE  As part of my medical decision making, I reviewed the following data within the electronic MEDICAL RECORD NUMBER Nursing notes reviewed and incorporated, Labs reviewed, EKG interpreted, Old chart reviewed, Radiograph reviewed and Notes from prior ED visits reviewed and incorporated        Thoughts are linear and organized, and patient has no AH, VH, or HI. Prior suicide attempt: Denies Prior Psychiatric Hospitalizations: Denies  Clinically patient displays no overt toxidrome; they are well appearing, with low suspicion for toxic ingestion given history and exam. Thoughts unlikely 2/2 anemia, hypothyroidism, infection, or ICH.  Consult: Psychiatry to evaluate patient for potential hold for danger to self. Disposition:   Discharge. Patient evaluated by both emergency and psychiatric professionals and deemed safe at this time for discharge. They have a plan for psychiatric follow up and a safe place to stay with access to physical and emotional support.       ____________________________________________   FINAL CLINICAL IMPRESSION(S) / ED DIAGNOSES  Final diagnoses:  Suicidal ideations     ED Discharge Orders    None       Note:  This document was prepared using  Dragon voice recognition software and may include unintentional dictation errors.   Merwyn Katos, MD 07/15/20 [phone]

## 2020-07-15 NOTE — ED Notes (Signed)
Pt discharged home. VS stable. All belongings returned to patient. Discharge instructions reviewed with patient. Resources given by psychiatrist.

## 2020-07-15 NOTE — ED Notes (Signed)
Robert Dunlap called for patient.   Pt will be allowed to call back, if he chooses.

## 2020-07-15 NOTE — ED Notes (Signed)
Pt allowed to use phone.
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
## 2022-07-31 ENCOUNTER — Emergency Department
Admission: EM | Admit: 2022-07-31 | Discharge: 2022-07-31 | Disposition: A | Discharge status: Home / Self Care | Payer: Medicaid Other | Attending: Emergency Medicine | Admitting: Emergency Medicine

## 2022-07-31 ENCOUNTER — Other Ambulatory Visit: Payer: Self-pay

## 2022-07-31 DIAGNOSIS — H01001 Unspecified blepharitis right upper eyelid: Secondary | ICD-10-CM

## 2022-07-31 DIAGNOSIS — L03213 Periorbital cellulitis: Secondary | ICD-10-CM

## 2022-07-31 MED ORDER — FLUORESCEIN SODIUM 1 MG OP STRP
1.0000 | ORAL_STRIP | Freq: Once | OPHTHALMIC | Status: AC
  Administered 2022-07-31: 1 via OPHTHALMIC
  Filled 2022-07-31: qty 1

## 2022-07-31 MED ORDER — ERYTHROMYCIN 5 MG/GM OP OINT: 1.0000 | TOPICAL_OINTMENT | Freq: Every day | OPHTHALMIC | 0 refills | Status: AC

## 2022-07-31 MED ORDER — TETRACAINE HCL 0.5 % OP SOLN
2.0000 [drp] | Freq: Once | OPHTHALMIC | Status: AC
  Administered 2022-07-31: 2 [drp] via OPHTHALMIC
  Filled 2022-07-31: qty 4

## 2022-07-31 MED ORDER — CEPHALEXIN 500 MG PO CAPS: 500.0000 mg | ORAL_CAPSULE | Freq: Four times a day (QID) | ORAL | 0 refills | Status: AC

## 2022-07-31 MED ORDER — DOXYCYCLINE MONOHYDRATE 100 MG PO TABS: 100.0000 mg | ORAL_TABLET | Freq: Two times a day (BID) | ORAL | 0 refills | Status: AC

## 2022-07-31 NOTE — ED Triage Notes (Signed)
Pt to ED for R eye swelling and pain since 3 days. Works with Haematologist. Periorbital area appears swollen and reddened. Denies vision changes. Hurts to blink.

## 2022-07-31 NOTE — ED Provider Notes (Signed)
[facility] Provider Note    Event Date/Time   First MD Initiated Contact with Patient 07/31/22 1329     (approximate)   History   Eye Pain   HPI  Robert Dunlap is a 32 y.o. male who presents today with right eye irritation.  Patient reports that he was working with fiberglass while insulating his attic 3 days ago.  He denies any feeling of eating something in his eye, but he has developed itching of his eyelashes, and subsequently swelling of his eyelid and periorbitally.  He denies any change in his vision.  He does not wear contact lenses.  He denies foreign body sensation or eye pain.  He has not had any discharge.  Patient Active Problem List   Diagnosis Date Noted   Opiate abuse, continuous 07/15/2020   Substance induced mood disorder 07/15/2020   Colitis 12/16/2016          Physical Exam   Triage Vital Signs: ED Triage Vitals  Enc Vitals Group     BP 07/31/22 1242 110/64     Pulse Rate 07/31/22 1242 79     Resp 07/31/22 1242 20     Temp 07/31/22 1242 98 F (36.7 C)     Temp Source 07/31/22 1242 Oral     SpO2 07/31/22 1242 96 %     Weight 07/31/22 1243 185 lb (83.9 kg)     Height 07/31/22 1243 6' (1.829 m)     Head Circumference --      Peak Flow --      Pain Score 07/31/22 1243 5     Pain Loc --      Pain Edu? --      Excl. in GC? --     Most recent vital signs: Vitals:   07/31/22 1242  BP: 110/64  Pulse: 79  Resp: 20  Temp: 98 F (36.7 C)  SpO2: 96%    Physical Exam Vitals and nursing note reviewed.  Constitutional:      General: Awake and alert. No acute distress.    Appearance: Normal appearance. The patient is normal weight.  HENT:     Head: Normocephalic and atraumatic.     Mouth: Mucous membranes are moist.  Eyes:     General: PERRL. Normal EOMs        Right eye: No discharge.  Pupil round and reactive.  Negative Seidel sign.  No scleral injection.  No fluorescein uptake.  No hyphema or hypopyon.  There is  swelling of his upper eyelid and periorbitally with faint erythema of the eyelid.  Normal, full, and painless extraocular movements.  No foreign body.  Eyelids everted.    Left eye: No discharge.     Conjunctiva/sclera: Conjunctivae normal.  Cardiovascular:     Rate and Rhythm: Normal rate and regular rhythm.     Pulses: Normal pulses.  Pulmonary:     Effort: Pulmonary effort is normal. No respiratory distress.     Breath sounds: Normal breath sounds.  Abdominal:     Abdomen is soft. There is no abdominal tenderness. No rebound or guarding. No distention. Musculoskeletal:        General: No swelling. Normal range of motion.     Cervical back: Normal range of motion and neck supple.  Skin:    General: Skin is warm and dry.     Capillary Refill: Capillary refill takes less than 2 seconds.     Findings: No rash.  Neurological:  Mental Status: The patient is awake and alert.      ED Results / Procedures / Treatments   Labs (all labs ordered are listed, but only abnormal results are displayed) Labs Reviewed - No data to display   EKG     RADIOLOGY     PROCEDURES:  Critical Care performed:   Procedures   MEDICATIONS ORDERED IN ED: Medications  tetracaine (PONTOCAINE) 0.5 % ophthalmic solution 2 drop (2 drops Right Eye Given 07/31/22 1358)  fluorescein ophthalmic strip 1 strip (1 strip Right Eye Given 07/31/22 1358)     IMPRESSION / MDM / ASSESSMENT AND PLAN / ED COURSE  I reviewed the triage vital signs and the nursing notes.   Differential diagnosis includes, but is not limited to, blepharitis, preseptal cellulitis, orbital cellulitis, retained foreign body, corneal abrasion, keratitis.  Patient is awake and alert, hemodynamically stable and afebrile.  His visual acuity is at his baseline.  He has swelling of his upper eyelid and periorbital area with faint erythema.  He has full and normal and painless extraocular movements, no resistance with retropulsion, not  consistent with orbital cellulitis.  There is no fluorescein uptake, not consistent with corneal abrasion, retained foreign body, or keratitis.  No photophobia or pain in the eyeball itself.  Negative Seidel sign, normal pupil, not consistent with open globe.  Given his itchy nests along his eyelashes, I suspect that his symptoms began with blepharitis and have since developed into an early preseptal cellulitis.  We will treat with antibiotics.  He was instructed to follow-up with ophthalmology for recheck in a couple of days, we discussed return precautions to the emergency department.  He does not wear contact lenses.  We discussed all findings and recommendations.  Patient understands and agrees with plan.  He was discharged in stable condition.   Patient's presentation is most consistent with acute complicated illness / injury requiring diagnostic workup.     FINAL CLINICAL IMPRESSION(S) / ED DIAGNOSES   Final diagnoses:  Blepharitis of right upper eyelid, unspecified type  Preseptal cellulitis of right eye     Rx / DC Orders   ED Discharge Orders          Ordered    erythromycin ophthalmic ointment  Daily at bedtime        07/31/22 1343    cephALEXin (KEFLEX) 500 MG capsule  4 times daily        07/31/22 1343    doxycycline (ADOXA) 100 MG tablet  2 times daily        07/31/22 1343             Note:  This document was prepared using Dragon voice recognition software and may include unintentional dictation errors.   Jackelyn Hoehn, PA-C 07/31/22 1359    Merwyn Katos, MD 07/31/22 1910

## 2022-07-31 NOTE — Discharge Instructions (Signed)
Take antibiotics as prescribed.  Please follow-up with ophthalmology if your symptoms persist.  Please return the emergency department immediately if anything gets worse, or if you develop any new, worsening, or change in symptoms or other concerns.  It was a pleasure caring for you today.

## 2024-01-31 ENCOUNTER — Other Ambulatory Visit: Payer: Self-pay

## 2024-01-31 ENCOUNTER — Emergency Department
Admission: EM | Admit: 2024-01-31 | Discharge: 2024-01-31 | Disposition: A | Discharge status: Home / Self Care | Payer: Self-pay | Attending: Emergency Medicine | Admitting: Emergency Medicine

## 2024-01-31 DIAGNOSIS — R238 Other skin changes: Secondary | ICD-10-CM | POA: Insufficient documentation

## 2024-01-31 DIAGNOSIS — L03115 Cellulitis of right lower limb: Secondary | ICD-10-CM | POA: Insufficient documentation

## 2024-01-31 MED ORDER — CEPHALEXIN 500 MG PO CAPS: 500.0000 mg | ORAL_CAPSULE | Freq: Four times a day (QID) | ORAL | 0 refills | Status: AC

## 2024-01-31 NOTE — Discharge Instructions (Addendum)
 You were evaluated in the ED for what appears to be cellulitis of the leg.  Please keep your leg elevated above heart level is much as possible.  Keep the area clean with gentle soap and water.  Take antibiotics as prescribed and until dose is complete.  Monitor for worsening spread of area and fever.  Return to ED for any new or worsening symptoms.  Pain control:  Ibuprofen (motrin/aleve karolyn) - You can take 3 tablets (600 mg) every 6 hours as needed for pain/fever.  Acetaminophen  (tylenol ) - You can take 2 extra strength tablets (1000 mg) every 6 hours as needed for pain/fever.  You can alternate these medications or take them together.  Make sure you eat food/drink water when taking these medications.

## 2024-01-31 NOTE — ED Triage Notes (Signed)
 Patient states he has an opened area to right lower leg; states it started out as a blister that popped and now it's painful to touch and reddened.

## 2024-01-31 NOTE — ED Notes (Signed)
 See triage note  Presents with possible abscess area to right lower leg  states area is open  Afebrile on arrival

## 2024-01-31 NOTE — ED Provider Notes (Signed)
 [facility] Emergency Department Provider Note     Event Date/Time   First MD Initiated Contact with Patient 01/31/24 1107     (approximate)   History   Abscess   HPI  Robert Dunlap is a 33 y.o. male presents to the ED for evaluation of a painful lesion to his right calf noticed 3 days ago.  Patient reports it initially appeared as a warm area such as a burn, he cannot recall being burned, then transition to fluid-filled boil and then popped and turned into an open scab. It is painful and warm to touch per patient.  He cannot recall being bitten by any insect, however states he works outside daily.  Denies fever or any systemic symptoms.  No other complaint.     Physical Exam   Triage Vital Signs: ED Triage Vitals  Encounter Vitals Group     BP 01/31/24 1029 135/73     Girls Systolic BP Percentile --      Girls Diastolic BP Percentile --      Boys Systolic BP Percentile --      Boys Diastolic BP Percentile --      Pulse Rate 01/31/24 1029 71     Resp 01/31/24 1029 20     Temp 01/31/24 1029 98.4 F (36.9 C)     Temp Source 01/31/24 1029 Oral     SpO2 01/31/24 1029 98 %     Weight 01/31/24 1030 220 lb (99.8 kg)     Height 01/31/24 1030 6' (1.829 m)     Head Circumference --      Peak Flow --      Pain Score 01/31/24 1030 4     Pain Loc --      Pain Education --      Exclude from Growth Chart --     Most recent vital signs: Vitals:   01/31/24 1029  BP: 135/73  Pulse: 71  Resp: 20  Temp: 98.4 F (36.9 C)  SpO2: 98%    General Awake, no distress.  HEENT NCAT.  CV:  Good peripheral perfusion.  RESP:  Normal effort.  ABD:  No distention.  Other:  Quarter sized, scab-like lesion to lateral aspect of right calf.  Well-circumscribed surrounding erythema.  Warm to touch.  Tender to palpation.  No discharge or drainage noted.  No streaking.   ED Results / Procedures / Treatments   Labs (all labs ordered are listed, but only  abnormal results are displayed) Labs Reviewed - No data to display  No results found.  PROCEDURES:  Critical Care performed: No  Procedures   MEDICATIONS ORDERED IN ED: Medications - No data to display   IMPRESSION / MDM / ASSESSMENT AND PLAN / ED COURSE  I reviewed the triage vital signs and the nursing notes.                               33 y.o. male presents to the emergency department for evaluation and treatment of skin lesions. See HPI for further details.   Differential diagnosis includes, but is not limited to cellulitis, abscess, insect bite, burn  Patient's presentation is most consistent with acute, uncomplicated illness.  Patient is alert and oriented.  He is hemodynamic stable and afebrile.  Physical exam findings are stated above.  Patient will be placed on oral antibiotics -Keflex  and encouraged to monitor the area of worsening signs  and symptoms.  Wound care education provided.  Advised close follow-up or return to ED if worsening symptoms.  He verbalized understanding.  Patient stable condition for discharge home.  ED return precaution discussed.  FINAL CLINICAL IMPRESSION(S) / ED DIAGNOSES   Final diagnoses:  Cellulitis of right lower extremity  Skin bulla   Rx / DC Orders   ED Discharge Orders          Ordered    cephALEXin  (KEFLEX ) 500 MG capsule  4 times daily        01/31/24 1120           Note:  This document was prepared using Dragon voice recognition software and may include unintentional dictation errors.    Margrette, Oneil Behney A, PA-C 01/31/24 1141    Arlander Charleston, MD 01/31/24 1157

## 2024-03-02 ENCOUNTER — Other Ambulatory Visit: Payer: Self-pay

## 2024-03-02 ENCOUNTER — Emergency Department
Admission: EM | Admit: 2024-03-02 | Discharge: 2024-03-02 | Disposition: A | Discharge status: Home / Self Care | Payer: Self-pay | Attending: Emergency Medicine | Admitting: Emergency Medicine

## 2024-03-02 DIAGNOSIS — Z5189 Encounter for other specified aftercare: Secondary | ICD-10-CM

## 2024-03-02 DIAGNOSIS — L989 Disorder of the skin and subcutaneous tissue, unspecified: Secondary | ICD-10-CM | POA: Insufficient documentation

## 2024-03-02 LAB — CBC WITH DIFFERENTIAL/PLATELET
Abs Immature Granulocytes: 0.01 K/uL (ref 0.00–0.07)
Basophils Absolute: 0 K/uL (ref 0.0–0.1)
Basophils Relative: 0 %
Eosinophils Absolute: 0.2 K/uL (ref 0.0–0.5)
Eosinophils Relative: 3 %
HCT: 47 % (ref 39.0–52.0)
Hemoglobin: 15.5 g/dL (ref 13.0–17.0)
Immature Granulocytes: 0 %
Lymphocytes Relative: 25 %
Lymphs Abs: 1.8 K/uL (ref 0.7–4.0)
MCH: 30.1 pg (ref 26.0–34.0)
MCHC: 33 g/dL (ref 30.0–36.0)
MCV: 91.3 fL (ref 80.0–100.0)
Monocytes Absolute: 0.7 K/uL (ref 0.1–1.0)
Monocytes Relative: 10 %
Neutro Abs: 4.3 K/uL (ref 1.7–7.7)
Neutrophils Relative %: 62 %
Platelets: 242 K/uL (ref 150–400)
RBC: 5.15 MIL/uL (ref 4.22–5.81)
RDW: 12.2 % (ref 11.5–15.5)
WBC: 7 K/uL (ref 4.0–10.5)
nRBC: 0 % (ref 0.0–0.2)

## 2024-03-02 MED ORDER — SULFAMETHOXAZOLE-TRIMETHOPRIM 800-160 MG PO TABS
1.0000 | ORAL_TABLET | Freq: Two times a day (BID) | ORAL | 0 refills | Status: AC
Start: 2024-03-02 — End: ?

## 2024-03-02 MED ORDER — CEPHALEXIN 500 MG PO CAPS: 1000.0000 mg | ORAL_CAPSULE | Freq: Two times a day (BID) | ORAL | 0 refills | Status: AC

## 2024-03-02 NOTE — ED Provider Notes (Signed)
 [facility] Emergency Department Provider Note     Event Date/Time   First MD Initiated Contact with Patient 03/02/24 1844     (approximate)   History   Abscess   HPI  Robert Dunlap is a 33 y.o. male with PMH of colitis, opioid abuse, substance induced mood disorder presents with a painful lesion on his right lateral calf.  Patient was seen in the ER on 01/31/2024 for the same complaint. He was discharged with Keflex .  See H&P from 01/31/2023.  He reports completing the antibiotics with some improvement, but has not completley resolved. He reports a pinkish drainage from the lesion and pain.  Denies fever or any systemic symptoms.   Physical Exam   Triage Vital Signs: ED Triage Vitals [03/02/24 1750]  Encounter Vitals Group     BP 114/76     Girls Systolic BP Percentile      Girls Diastolic BP Percentile      Boys Systolic BP Percentile      Boys Diastolic BP Percentile      Pulse Rate 62     Resp 18     Temp 98 F (36.7 C)     Temp src      SpO2 100 %     Weight 200 lb (90.7 kg)     Height 6' (1.829 m)     Head Circumference      Peak Flow      Pain Score 4     Pain Loc      Pain Education      Exclude from Growth Chart     Most recent vital signs: Vitals:   03/02/24 1750  BP: 114/76  Pulse: 62  Resp: 18  Temp: 98 F (36.7 C)  SpO2: 100%    General: Well appearing and comfortable. Alert and oriented. INAD.  Skin:   There is a 1 cm open lesion on his right lateral lower calf that is erythematous with purulent discharge.  There is no induration or fluctuance.  The area is tender to touch CV:  Good peripheral perfusion. RRR. No peripheral edema.  RESP:  Normal effort. LCTAB.  OTHER:      ED Results / Procedures / Treatments   Labs (all labs ordered are listed, but only abnormal results are displayed) Labs Reviewed  AEROBIC/ANAEROBIC CULTURE W GRAM STAIN (SURGICAL/DEEP WOUND)  CBC WITH DIFFERENTIAL/PLATELET   No results  found.  PROCEDURES:  Critical Care performed: No  Procedures  MEDICATIONS ORDERED IN ED: Medications - No data to display  IMPRESSION / MDM / ASSESSMENT AND PLAN / ED COURSE  I reviewed the triage vital signs and the nursing notes.                               33 y.o. male presents to the emergency department for evaluation and treatment of a nonhealing lesion. See HPI for further details.   Differential diagnosis includes, but is not limited to abscess, cellulitis, wound, MRSA  Patient's presentation is most consistent with acute, uncomplicated illness.  Patient is alert and oriented, he is hemodynamically stable and afebrile.  PE findings as stated above.The wound was swabbed for culture and is pending.  CBC is reassuring.  Patient will be started on a new course of antibiotics Keflex  and Bactrim  for MRSA coverage.  The lesion was cleaned and dressed and patient was encouraged to monitor the  area for worsening symptoms.  Wound care education provided including the discontinuation of using rubbing alcohol and hydrogen peroxide over the affected area.  Patient verbalized understanding.  He is in stable condition for discharge home.  ED return precautions discussed.  Ambulatory referral to primary care ordered.   FINAL CLINICAL IMPRESSION(S) / ED DIAGNOSES   Final diagnoses:  Visit for wound check   Rx / DC Orders   ED Discharge Orders          Ordered    cephALEXin  (KEFLEX ) 500 MG capsule  2 times daily        03/02/24 1959    sulfamethoxazole-trimethoprim (BACTRIM DS) 800-160 MG tablet  2 times daily        03/02/24 1959    Ambulatory Referral to Primary Care (Establish Care)        03/02/24 2046             Note:  This document was prepared using Dragon voice recognition software and may include unintentional dictation errors.    Margrette, Vernessa Likes A, PA-C 03/02/24 2240    Dicky Anes, MD 03/04/24 [phone]

## 2024-03-02 NOTE — ED Triage Notes (Signed)
 Pt to ED for right lower leg abscess, was seen a couple weeks ago and given antibiotics

## 2024-03-02 NOTE — Discharge Instructions (Addendum)
 Keep wound site clean and dry.  Gently use soap and water to keep the area clean. DO NOT USE alcohol, hydrogen peroxide etc, to clean skin. You may cover the incision with clean gauze & replace it after your daily shower for your comfort.   Take antibiotics as prescribed.  A wound culture is pending.  Follow-up with your primary care provider for further evaluation.  A referral has been sent for you.

## 2024-03-08 LAB — AEROBIC/ANAEROBIC CULTURE W GRAM STAIN (SURGICAL/DEEP WOUND): Gram Stain: NONE SEEN
